# Patient Record
Sex: Female | Born: 1985 | Race: White | Hispanic: No | Marital: Single | Smoking: Never smoker
Health system: Southern US, Community
[De-identification: ages and names within clinical notes are randomized; demographics above are authoritative.]

## PROBLEM LIST (undated history)

## (undated) DIAGNOSIS — B9689 Other specified bacterial agents as the cause of diseases classified elsewhere: Secondary | ICD-10-CM

## (undated) DIAGNOSIS — J309 Allergic rhinitis, unspecified: Secondary | ICD-10-CM

## (undated) DIAGNOSIS — E669 Obesity, unspecified: Secondary | ICD-10-CM

## (undated) DIAGNOSIS — F419 Anxiety disorder, unspecified: Secondary | ICD-10-CM

## (undated) DIAGNOSIS — R21 Rash and other nonspecific skin eruption: Secondary | ICD-10-CM

## (undated) DIAGNOSIS — Z9889 Other specified postprocedural states: Secondary | ICD-10-CM

## (undated) DIAGNOSIS — L709 Acne, unspecified: Secondary | ICD-10-CM

## (undated) DIAGNOSIS — N390 Urinary tract infection, site not specified: Secondary | ICD-10-CM

## (undated) DIAGNOSIS — N760 Acute vaginitis: Secondary | ICD-10-CM

## (undated) DIAGNOSIS — B379 Candidiasis, unspecified: Secondary | ICD-10-CM

## (undated) DIAGNOSIS — K219 Gastro-esophageal reflux disease without esophagitis: Secondary | ICD-10-CM

## (undated) DIAGNOSIS — T1490XA Injury, unspecified, initial encounter: Secondary | ICD-10-CM

## (undated) DIAGNOSIS — R61 Generalized hyperhidrosis: Secondary | ICD-10-CM

## (undated) DIAGNOSIS — G43909 Migraine, unspecified, not intractable, without status migrainosus: Secondary | ICD-10-CM

## (undated) DIAGNOSIS — J45909 Unspecified asthma, uncomplicated: Secondary | ICD-10-CM

## (undated) DIAGNOSIS — K2100 Gastro-esophageal reflux disease with esophagitis, without bleeding: Secondary | ICD-10-CM

## (undated) DIAGNOSIS — E069 Thyroiditis, unspecified: Secondary | ICD-10-CM

## (undated) DIAGNOSIS — N3281 Overactive bladder: Secondary | ICD-10-CM

## (undated) DIAGNOSIS — M1990 Unspecified osteoarthritis, unspecified site: Secondary | ICD-10-CM

## (undated) DIAGNOSIS — F32A Depression, unspecified: Secondary | ICD-10-CM

## (undated) DIAGNOSIS — T7840XA Allergy, unspecified, initial encounter: Secondary | ICD-10-CM

## (undated) DIAGNOSIS — IMO0002 Reserved for concepts with insufficient information to code with codable children: Secondary | ICD-10-CM

## (undated) DIAGNOSIS — M545 Low back pain, unspecified: Secondary | ICD-10-CM

## (undated) DIAGNOSIS — Z7689 Persons encountering health services in other specified circumstances: Secondary | ICD-10-CM

## (undated) DIAGNOSIS — L0292 Furuncle, unspecified: Secondary | ICD-10-CM

## (undated) DIAGNOSIS — F329 Major depressive disorder, single episode, unspecified: Secondary | ICD-10-CM

## (undated) DIAGNOSIS — R112 Nausea with vomiting, unspecified: Secondary | ICD-10-CM

## (undated) DIAGNOSIS — L509 Urticaria, unspecified: Secondary | ICD-10-CM

## (undated) DIAGNOSIS — R519 Headache, unspecified: Secondary | ICD-10-CM

## (undated) HISTORY — DX: Generalized hyperhidrosis: R61

## (undated) HISTORY — DX: Gastro-esophageal reflux disease with esophagitis, without bleeding: K21.00

## (undated) HISTORY — DX: Rash and other nonspecific skin eruption: R21

## (undated) HISTORY — DX: Overactive bladder: N32.81

## (undated) HISTORY — DX: Obesity, unspecified: E66.9

## (undated) HISTORY — DX: Migraine, unspecified, not intractable, without status migrainosus: G43.909

## (undated) HISTORY — DX: Headache, unspecified: R51.9

## (undated) HISTORY — DX: Candidiasis, unspecified: B37.9

## (undated) HISTORY — DX: Furuncle, unspecified: L02.92

## (undated) HISTORY — DX: Reserved for concepts with insufficient information to code with codable children: IMO0002

## (undated) HISTORY — DX: Depression, unspecified: F32.A

## (undated) HISTORY — DX: Other specified bacterial agents as the cause of diseases classified elsewhere: B96.89

## (undated) HISTORY — DX: Anxiety disorder, unspecified: F41.9

## (undated) HISTORY — DX: Injury, unspecified, initial encounter: T14.90XA

## (undated) HISTORY — DX: Allergy, unspecified, initial encounter: T78.40XA

## (undated) HISTORY — DX: Allergic rhinitis, unspecified: J30.9

## (undated) HISTORY — DX: Low back pain, unspecified: M54.50

## (undated) HISTORY — DX: Thyroiditis, unspecified: E06.9

## (undated) HISTORY — DX: Unspecified asthma, uncomplicated: J45.909

## (undated) HISTORY — DX: Major depressive disorder, single episode, unspecified: F32.9

## (undated) HISTORY — DX: Urticaria, unspecified: L50.9

## (undated) HISTORY — DX: Urinary tract infection, site not specified: N39.0

## (undated) HISTORY — DX: Acne, unspecified: L70.9

## (undated) HISTORY — PX: APPENDECTOMY: SHX54

## (undated) HISTORY — DX: Other specified bacterial agents as the cause of diseases classified elsewhere: N76.0

## (undated) HISTORY — DX: Persons encountering health services in other specified circumstances: Z76.89

---
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
## 2003-06-28 ENCOUNTER — Ambulatory Visit (HOSPITAL_COMMUNITY): Admission: RE | Admit: 2003-06-28 | Discharge: 2003-06-28 | Payer: Self-pay | Admitting: Advanced Practice Midwife

## 2007-08-25 ENCOUNTER — Other Ambulatory Visit: Admission: RE | Admit: 2007-08-25 | Discharge: 2007-08-25 | Payer: Self-pay | Admitting: Obstetrics and Gynecology

## 2008-07-14 ENCOUNTER — Ambulatory Visit (HOSPITAL_COMMUNITY): Admission: RE | Admit: 2008-07-14 | Discharge: 2008-07-14 | Payer: Self-pay | Admitting: Obstetrics & Gynecology

## 2008-08-28 ENCOUNTER — Other Ambulatory Visit: Admission: RE | Admit: 2008-08-28 | Discharge: 2008-08-28 | Payer: Self-pay | Admitting: Obstetrics & Gynecology

## 2009-11-02 ENCOUNTER — Other Ambulatory Visit: Admission: RE | Admit: 2009-11-02 | Discharge: 2009-11-02 | Payer: Self-pay | Admitting: Obstetrics & Gynecology

## 2011-10-06 LAB — LIPID PANEL
Cholesterol: 195 (ref 0–200)
HDL: 41 (ref 35–70)
LDL Cholesterol: 112
Triglycerides: 209 — AB (ref 40–160)

## 2011-10-06 LAB — TSH: TSH: 4.46 (ref <=5.90)

## 2012-07-05 ENCOUNTER — Encounter: Payer: Self-pay | Admitting: *Deleted

## 2012-07-05 ENCOUNTER — Ambulatory Visit (INDEPENDENT_AMBULATORY_CARE_PROVIDER_SITE_OTHER): Payer: Medicaid Other | Admitting: Obstetrics & Gynecology

## 2012-07-05 ENCOUNTER — Encounter: Payer: Self-pay | Admitting: Obstetrics & Gynecology

## 2012-07-05 VITALS — BP 116/80 | Ht 63.0 in | Wt 233.0 lb

## 2012-07-05 DIAGNOSIS — Z3202 Encounter for pregnancy test, result negative: Secondary | ICD-10-CM

## 2012-07-05 DIAGNOSIS — Z3049 Encounter for surveillance of other contraceptives: Secondary | ICD-10-CM

## 2012-07-05 DIAGNOSIS — Z3200 Encounter for pregnancy test, result unknown: Secondary | ICD-10-CM

## 2012-07-05 DIAGNOSIS — Z309 Encounter for contraceptive management, unspecified: Secondary | ICD-10-CM

## 2012-07-05 MED ORDER — MEDROXYPROGESTERONE ACETATE 150 MG/ML IM SUSP
150.0000 mg | Freq: Once | INTRAMUSCULAR | Status: AC
  Administered 2012-07-05: 150 mg via INTRAMUSCULAR

## 2012-08-13 ENCOUNTER — Other Ambulatory Visit: Payer: Self-pay | Admitting: Adult Health

## 2012-09-22 ENCOUNTER — Other Ambulatory Visit: Payer: Self-pay | Admitting: Adult Health

## 2012-09-24 ENCOUNTER — Ambulatory Visit: Payer: Medicaid Other

## 2012-10-01 ENCOUNTER — Ambulatory Visit (INDEPENDENT_AMBULATORY_CARE_PROVIDER_SITE_OTHER): Payer: Medicaid Other | Admitting: Obstetrics & Gynecology

## 2012-10-01 ENCOUNTER — Encounter: Payer: Self-pay | Admitting: Obstetrics & Gynecology

## 2012-10-01 VITALS — BP 100/70 | Ht 62.0 in | Wt 229.0 lb

## 2012-10-01 DIAGNOSIS — Z3202 Encounter for pregnancy test, result negative: Secondary | ICD-10-CM

## 2012-10-01 DIAGNOSIS — Z3049 Encounter for surveillance of other contraceptives: Secondary | ICD-10-CM

## 2012-10-01 MED ORDER — MEDROXYPROGESTERONE ACETATE 150 MG/ML IM SUSP
150.0000 mg | Freq: Once | INTRAMUSCULAR | Status: AC
  Administered 2012-10-01: 150 mg via INTRAMUSCULAR

## 2012-10-01 NOTE — Progress Notes (Signed)
Pt here for Depo Provera. Complaining of burning with urination. Urine dip was negative. Advised if burning continues to see dr. Rock Schneider scheduled to see Kendra Schneider Monday. JSY

## 2012-12-24 ENCOUNTER — Other Ambulatory Visit: Payer: Self-pay | Admitting: Adult Health

## 2012-12-31 ENCOUNTER — Encounter: Payer: Self-pay | Admitting: Obstetrics & Gynecology

## 2012-12-31 ENCOUNTER — Encounter (INDEPENDENT_AMBULATORY_CARE_PROVIDER_SITE_OTHER): Payer: Self-pay

## 2012-12-31 ENCOUNTER — Ambulatory Visit (INDEPENDENT_AMBULATORY_CARE_PROVIDER_SITE_OTHER): Payer: Medicaid Other | Admitting: Obstetrics & Gynecology

## 2012-12-31 VITALS — BP 102/60 | Ht 62.0 in | Wt 229.5 lb

## 2012-12-31 DIAGNOSIS — Z3202 Encounter for pregnancy test, result negative: Secondary | ICD-10-CM

## 2012-12-31 DIAGNOSIS — Z3049 Encounter for surveillance of other contraceptives: Secondary | ICD-10-CM

## 2012-12-31 DIAGNOSIS — R300 Dysuria: Secondary | ICD-10-CM

## 2012-12-31 LAB — POCT URINALYSIS DIPSTICK
Blood, UA: NEGATIVE
Glucose, UA: NEGATIVE
Ketones, UA: NEGATIVE
Leukocytes, UA: NEGATIVE
Nitrite, UA: NEGATIVE
Protein, UA: NEGATIVE

## 2012-12-31 MED ORDER — MEDROXYPROGESTERONE ACETATE 150 MG/ML IM SUSP
150.0000 mg | Freq: Once | INTRAMUSCULAR | Status: AC
  Administered 2012-12-31: 150 mg via INTRAMUSCULAR

## 2012-12-31 NOTE — Progress Notes (Signed)
Pt here for Depo shot. Pt complained of burning with urination, states this is an ongoing problem. Urine dip was negative here. Advised to increase fluids and if it continued, call Dr. Juanetta Gosling. Pt voiced understanding. JSY

## 2013-02-24 ENCOUNTER — Other Ambulatory Visit: Payer: Self-pay | Admitting: Adult Health

## 2013-03-21 ENCOUNTER — Other Ambulatory Visit: Payer: Self-pay | Admitting: Adult Health

## 2013-03-25 ENCOUNTER — Encounter: Payer: Self-pay | Admitting: Adult Health

## 2013-03-25 ENCOUNTER — Ambulatory Visit (INDEPENDENT_AMBULATORY_CARE_PROVIDER_SITE_OTHER): Payer: Medicaid Other | Admitting: Adult Health

## 2013-03-25 ENCOUNTER — Encounter (INDEPENDENT_AMBULATORY_CARE_PROVIDER_SITE_OTHER): Payer: Self-pay

## 2013-03-25 VITALS — BP 112/72 | Ht 65.0 in | Wt 231.5 lb

## 2013-03-25 DIAGNOSIS — Z3202 Encounter for pregnancy test, result negative: Secondary | ICD-10-CM

## 2013-03-25 DIAGNOSIS — Z309 Encounter for contraceptive management, unspecified: Secondary | ICD-10-CM

## 2013-03-25 DIAGNOSIS — Z3049 Encounter for surveillance of other contraceptives: Secondary | ICD-10-CM

## 2013-03-25 LAB — POCT URINE PREGNANCY: PREG TEST UR: NEGATIVE

## 2013-03-25 MED ORDER — MEDROXYPROGESTERONE ACETATE 150 MG/ML IM SUSP
150.0000 mg | Freq: Once | INTRAMUSCULAR | Status: AC
Start: 2013-03-25 — End: 2013-03-25
  Administered 2013-03-25: 150 mg via INTRAMUSCULAR

## 2013-03-25 NOTE — Progress Notes (Signed)
Pt here for Depo shot. Pt with leg pain. Advised to contact PCP for advise. Pt to return in 12 weeks for next shot. [place]

## 2013-04-19 ENCOUNTER — Other Ambulatory Visit: Payer: Self-pay | Admitting: Adult Health

## 2013-06-17 ENCOUNTER — Encounter: Payer: Self-pay | Admitting: Obstetrics & Gynecology

## 2013-06-17 ENCOUNTER — Other Ambulatory Visit: Payer: Self-pay | Admitting: Adult Health

## 2013-06-17 ENCOUNTER — Ambulatory Visit (INDEPENDENT_AMBULATORY_CARE_PROVIDER_SITE_OTHER): Payer: Medicaid Other | Admitting: Obstetrics & Gynecology

## 2013-06-17 VITALS — BP 98/60 | Ht 62.0 in | Wt 226.5 lb

## 2013-06-17 DIAGNOSIS — Z3049 Encounter for surveillance of other contraceptives: Secondary | ICD-10-CM

## 2013-06-17 DIAGNOSIS — Z309 Encounter for contraceptive management, unspecified: Secondary | ICD-10-CM

## 2013-06-17 DIAGNOSIS — Z3202 Encounter for pregnancy test, result negative: Secondary | ICD-10-CM

## 2013-06-17 LAB — POCT URINE PREGNANCY: Preg Test, Ur: NEGATIVE

## 2013-06-17 MED ORDER — MEDROXYPROGESTERONE ACETATE 150 MG/ML IM SUSP
150.0000 mg | Freq: Once | INTRAMUSCULAR | Status: AC
  Administered 2013-06-17: 150 mg via INTRAMUSCULAR

## 2013-06-17 NOTE — Progress Notes (Signed)
Pt here for Depo shot. No complaints at this time. To return in 12 weeks for next shot. [place]

## 2013-07-22 ENCOUNTER — Encounter: Payer: Self-pay | Admitting: Adult Health

## 2013-07-22 ENCOUNTER — Other Ambulatory Visit (HOSPITAL_COMMUNITY)
Admission: RE | Admit: 2013-07-22 | Discharge: 2013-07-22 | Disposition: A | Discharge status: Home / Self Care | Payer: Medicaid Other | Source: Ambulatory Visit | Attending: Adult Health | Admitting: Adult Health

## 2013-07-22 ENCOUNTER — Ambulatory Visit (INDEPENDENT_AMBULATORY_CARE_PROVIDER_SITE_OTHER): Payer: Medicaid Other | Admitting: Adult Health

## 2013-07-22 VITALS — BP 104/60 | HR 78 | Ht 60.0 in | Wt 221.0 lb

## 2013-07-22 DIAGNOSIS — Z0000 Encounter for general adult medical examination without abnormal findings: Secondary | ICD-10-CM

## 2013-07-22 DIAGNOSIS — Z01419 Encounter for gynecological examination (general) (routine) without abnormal findings: Secondary | ICD-10-CM | POA: Insufficient documentation

## 2013-07-22 DIAGNOSIS — Z1151 Encounter for screening for human papillomavirus (HPV): Secondary | ICD-10-CM | POA: Insufficient documentation

## 2013-07-22 DIAGNOSIS — B379 Candidiasis, unspecified: Secondary | ICD-10-CM

## 2013-07-22 HISTORY — DX: Candidiasis, unspecified: B37.9

## 2013-07-22 MED ORDER — FLUCONAZOLE 150 MG PO TABS: ORAL_TABLET | ORAL | Status: DC

## 2013-07-22 NOTE — Progress Notes (Signed)
Patient ID: Kendra Schneider, female   DOB: 1985/11/29, 28 y.o.   MRN: [id number] History of Present Illness: Kendra Schneider is a 28 year old white female, in for pap and physical.Happy with Depo.   Current Medications, Allergies, Past Medical History, Past Surgical History, Family History and Social History were reviewed in [employer] record.     Review of Systems: Patient denies any headaches, blurred vision, shortness of breath, chest pain, abdominal pain, problems with bowel movements, urination.She does not have sex,her legs ache near groin, moods good, thinks she has yeast.    Physical Exam:BP 104/60  Pulse 78  Ht 5' (1.524 m)  Wt 221 lb (100.245 kg)  BMI 43.16 kg/m2 General:  Well developed, well nourished, no acute distress Skin:  Warm and dry Neck:  Midline trachea, normal thyroid Lungs; Clear to auscultation bilaterally Breast:  No dominant palpable mass, retraction, or nipple discharge Cardiovascular: Regular rate and rhythm Abdomen:  Soft, non tender, no hepatosplenomegaly,obese Pelvic:  External genitalia is normal in appearance.  The vagina is red and irritated, scant white thin discharge.Looks like yeast. The cervix is not well visualized due to pt holding legs together,pap with HPV performed by blind seep.  Uterus is felt to be normal size, shape, and contour.  No  adnexal masses or tenderness noted. Extremities:  No swelling or varicosities noted Psych:  No mood , alert and cooperative, seems happy   Impression: Yearly gyn exam Yeast     Plan: Physical in 1 year Rx diflucan 150 mg #2 1 now and 1 in 3 days with 2 refills Keep clean and dry Get depo in august as scheduled

## 2013-07-22 NOTE — Patient Instructions (Signed)
Monilial Vaginitis Vaginitis in a soreness, swelling and redness (inflammation) of the vagina and vulva. Monilial vaginitis is not a sexually transmitted infection. CAUSES  Yeast vaginitis is caused by yeast (candida) that is normally found in your vagina. With a yeast infection, the candida has overgrown in number to a point that upsets the chemical balance. SYMPTOMS   White, thick vaginal discharge.  Swelling, itching, redness and irritation of the vagina and possibly the lips of the vagina (vulva).  Burning or painful urination.  Painful intercourse. DIAGNOSIS  Things that may contribute to monilial vaginitis are:  Postmenopausal and virginal states.  Pregnancy.  Infections.  Being tired, sick or stressed, especially if you had monilial vaginitis in the past.  Diabetes. Good control will help lower the chance.  Birth control pills.  Tight fitting garments.  Using bubble bath, feminine sprays, douches or deodorant tampons.  Taking certain medications that kill germs (antibiotics).  Sporadic recurrence can occur if you become ill. TREATMENT  Your caregiver will give you medication.  There are several kinds of anti monilial vaginal creams and suppositories specific for monilial vaginitis. For recurrent yeast infections, use a suppository or cream in the vagina 2 times a week, or as directed.  Anti-monilial or steroid cream for the itching or irritation of the vulva may also be used. Get your caregiver's permission.  Painting the vagina with methylene blue solution may help if the monilial cream does not work.  Eating yogurt may help prevent monilial vaginitis. HOME CARE INSTRUCTIONS   Finish all medication as prescribed.  Do not have sex until treatment is completed or after your caregiver tells you it is okay.  Take warm sitz baths.  Do not douche.  Do not use tampons, especially scented ones.  Wear cotton underwear.  Avoid tight pants and panty  hose.  Tell your sexual partner that you have a yeast infection. They should go to their caregiver if they have symptoms such as mild rash or itching.  Your sexual partner should be treated as well if your infection is difficult to eliminate.  Practice safer sex. Use condoms.  Some vaginal medications cause latex condoms to fail. Vaginal medications that harm condoms are:  Cleocin cream.  Butoconazole (Femstat).  Terconazole (Terazol) vaginal suppository.  Miconazole (Monistat) (may be purchased over the counter). SEEK MEDICAL CARE IF:   You have a temperature by mouth above 102 F (38.9 C).  The infection is getting worse after 2 days of treatment.  The infection is not getting better after 3 days of treatment.  You develop blisters in or around your vagina.  You develop vaginal bleeding, and it is not your menstrual period.  You have pain when you urinate.  You develop intestinal problems.  You have pain with sexual intercourse. Document Released: 10/23/2004 Document Revised: 04/07/2011 Document Reviewed: 07/07/2008 [facility] Patient Information 2015 [place], [place]. This information is not intended to replace advice given to you by your health care provider. Make sure you discuss any questions you have with your health care provider. Physical in 1 year Pap in 3 years

## 2013-07-25 LAB — CYTOLOGY - PAP

## 2013-09-09 ENCOUNTER — Ambulatory Visit (INDEPENDENT_AMBULATORY_CARE_PROVIDER_SITE_OTHER): Payer: Medicaid Other | Admitting: Adult Health

## 2013-09-09 ENCOUNTER — Encounter: Payer: Self-pay | Admitting: Adult Health

## 2013-09-09 DIAGNOSIS — Z3202 Encounter for pregnancy test, result negative: Secondary | ICD-10-CM

## 2013-09-09 DIAGNOSIS — Z309 Encounter for contraceptive management, unspecified: Secondary | ICD-10-CM

## 2013-09-09 DIAGNOSIS — Z3049 Encounter for surveillance of other contraceptives: Secondary | ICD-10-CM

## 2013-09-09 LAB — POCT URINE PREGNANCY: Preg Test, Ur: NEGATIVE

## 2013-09-09 MED ORDER — MEDROXYPROGESTERONE ACETATE 150 MG/ML IM SUSP
150.0000 mg | Freq: Once | INTRAMUSCULAR | Status: AC
  Administered 2013-09-09: 150 mg via INTRAMUSCULAR

## 2013-10-07 ENCOUNTER — Ambulatory Visit: Payer: Self-pay | Admitting: Endocrinology

## 2013-11-08 ENCOUNTER — Encounter: Payer: Self-pay | Admitting: Endocrinology

## 2013-11-08 ENCOUNTER — Ambulatory Visit (INDEPENDENT_AMBULATORY_CARE_PROVIDER_SITE_OTHER): Payer: Medicaid Other | Admitting: Endocrinology

## 2013-11-08 VITALS — BP 122/86 | HR 77 | Temp 98.7°F | Ht 60.0 in | Wt 218.0 lb

## 2013-11-08 DIAGNOSIS — E065 Other chronic thyroiditis: Secondary | ICD-10-CM
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
## 2013-11-08 NOTE — Patient Instructions (Addendum)
Your thyroid is normal now, so no treatment is needed for that. You have a higher than average risk of an underactive thyroid, as well as other immune conditions. You should have your thyroid checked at least year.   I would be happy to see you back here whenever you want.

## 2013-11-09 ENCOUNTER — Other Ambulatory Visit: Payer: Self-pay | Admitting: Adult Health

## 2013-11-09 DIAGNOSIS — E065 Other chronic thyroiditis: Secondary | ICD-10-CM | POA: Insufficient documentation

## 2013-12-02 ENCOUNTER — Encounter: Payer: Self-pay | Admitting: Adult Health

## 2013-12-02 ENCOUNTER — Ambulatory Visit (INDEPENDENT_AMBULATORY_CARE_PROVIDER_SITE_OTHER): Payer: Medicaid Other | Admitting: Adult Health

## 2013-12-02 DIAGNOSIS — Z3202 Encounter for pregnancy test, result negative: Secondary | ICD-10-CM

## 2013-12-02 DIAGNOSIS — Z3042 Encounter for surveillance of injectable contraceptive: Secondary | ICD-10-CM

## 2013-12-02 LAB — POCT URINE PREGNANCY: PREG TEST UR: NEGATIVE

## 2013-12-02 MED ORDER — MEDROXYPROGESTERONE ACETATE 150 MG/ML IM SUSP
150.0000 mg | Freq: Once | INTRAMUSCULAR | Status: AC
  Administered 2013-12-02: 150 mg via INTRAMUSCULAR

## 2014-01-31 ENCOUNTER — Other Ambulatory Visit: Payer: Self-pay | Admitting: Adult Health

## 2014-02-23 ENCOUNTER — Other Ambulatory Visit: Payer: Self-pay | Admitting: Adult Health

## 2014-02-24 ENCOUNTER — Ambulatory Visit (INDEPENDENT_AMBULATORY_CARE_PROVIDER_SITE_OTHER): Payer: Medicaid Other | Admitting: *Deleted

## 2014-02-24 ENCOUNTER — Encounter: Payer: Self-pay | Admitting: *Deleted

## 2014-02-24 DIAGNOSIS — Z3042 Encounter for surveillance of injectable contraceptive: Secondary | ICD-10-CM

## 2014-02-24 DIAGNOSIS — Z3202 Encounter for pregnancy test, result negative: Secondary | ICD-10-CM

## 2014-02-24 DIAGNOSIS — R300 Dysuria: Secondary | ICD-10-CM

## 2014-02-24 LAB — POCT URINALYSIS DIPSTICK
Glucose, UA: NEGATIVE
KETONES UA: NEGATIVE
Nitrite, UA: NEGATIVE
Protein, UA: NEGATIVE

## 2014-02-24 LAB — POCT URINE PREGNANCY: Preg Test, Ur: NEGATIVE

## 2014-02-24 MED ORDER — MEDROXYPROGESTERONE ACETATE 150 MG/ML IM SUSP
150.0000 mg | Freq: Once | INTRAMUSCULAR | Status: AC
  Administered 2014-02-24: 150 mg via INTRAMUSCULAR

## 2014-02-24 NOTE — Progress Notes (Signed)
Pt here for Depo. Pt reports burning with urination for a while. I dipped urine and it showed 2+ leuk and a trace of blood. Pt is wanting an antibiotic. I spoke with JAG and she recommends to push fluids. No antibiotic needed at this time. Pt voiced understanding. [place]

## 2014-03-28 ENCOUNTER — Encounter: Payer: Self-pay | Admitting: Family Medicine

## 2014-05-19 ENCOUNTER — Ambulatory Visit: Payer: Medicaid Other

## 2014-05-22 ENCOUNTER — Encounter: Payer: Self-pay | Admitting: *Deleted

## 2014-05-22 ENCOUNTER — Ambulatory Visit (INDEPENDENT_AMBULATORY_CARE_PROVIDER_SITE_OTHER): Payer: Medicaid Other | Admitting: *Deleted

## 2014-05-22 DIAGNOSIS — Z3042 Encounter for surveillance of injectable contraceptive: Secondary | ICD-10-CM | POA: Diagnosis not present

## 2014-05-22 DIAGNOSIS — Z3202 Encounter for pregnancy test, result negative: Secondary | ICD-10-CM

## 2014-05-22 LAB — POCT URINE PREGNANCY: Preg Test, Ur: NEGATIVE

## 2014-05-22 MED ORDER — MEDROXYPROGESTERONE ACETATE 150 MG/ML IM SUSP
150.0000 mg | Freq: Once | INTRAMUSCULAR | Status: AC
  Administered 2014-05-22: 150 mg via INTRAMUSCULAR

## 2014-05-22 NOTE — Progress Notes (Signed)
Pt here for Depo. Reports no problems at this time. Return in 12 weeks for next shot. JSY 

## 2014-05-26 ENCOUNTER — Other Ambulatory Visit: Payer: Self-pay | Admitting: Adult Health

## 2014-06-28 ENCOUNTER — Other Ambulatory Visit: Payer: Self-pay | Admitting: Adult Health

## 2014-08-18 ENCOUNTER — Ambulatory Visit (INDEPENDENT_AMBULATORY_CARE_PROVIDER_SITE_OTHER): Payer: Medicaid Other | Admitting: *Deleted

## 2014-08-18 DIAGNOSIS — Z3202 Encounter for pregnancy test, result negative: Secondary | ICD-10-CM | POA: Diagnosis not present

## 2014-08-18 DIAGNOSIS — Z3042 Encounter for surveillance of injectable contraceptive: Secondary | ICD-10-CM | POA: Diagnosis not present

## 2014-08-18 MED ORDER — MEDROXYPROGESTERONE ACETATE 150 MG/ML IM SUSP
150.0000 mg | Freq: Once | INTRAMUSCULAR | Status: AC
  Administered 2014-08-18: 150 mg via INTRAMUSCULAR

## 2014-08-18 NOTE — Progress Notes (Signed)
Patient ID: Kendra Schneider, female   DOB: 1985-04-26, 29 y.o.   MRN: [id number] Pt was here today for Depo Provera injection, pt reports no problems at this time.  Injection given in rt buttock and informed to return in 3 months for next injection

## 2014-08-21 LAB — POCT URINE PREGNANCY: PREG TEST UR: NEGATIVE

## 2014-09-12 ENCOUNTER — Ambulatory Visit (INDEPENDENT_AMBULATORY_CARE_PROVIDER_SITE_OTHER): Payer: Medicaid Other | Admitting: Urology

## 2014-09-12 DIAGNOSIS — R300 Dysuria: Secondary | ICD-10-CM

## 2014-11-10 ENCOUNTER — Ambulatory Visit (INDEPENDENT_AMBULATORY_CARE_PROVIDER_SITE_OTHER): Payer: Medicaid Other | Admitting: *Deleted

## 2014-11-10 DIAGNOSIS — Z3042 Encounter for surveillance of injectable contraceptive: Secondary | ICD-10-CM | POA: Diagnosis not present

## 2014-11-10 DIAGNOSIS — Z3202 Encounter for pregnancy test, result negative: Secondary | ICD-10-CM

## 2014-11-10 LAB — POCT URINE PREGNANCY: PREG TEST UR: NEGATIVE

## 2014-11-10 MED ORDER — MEDROXYPROGESTERONE ACETATE 150 MG/ML IM SUSP
150.0000 mg | Freq: Once | INTRAMUSCULAR | Status: AC
  Administered 2014-11-10: 150 mg via INTRAMUSCULAR

## 2014-11-10 NOTE — Progress Notes (Signed)
Patient ID: Kendra Schneider, female   DOB: [birth year], 29 y.o.   MRN: [id number] Depo provera 150 mg IM given left ventrogluteal with no complications, negative pregnancy test. Pt to return in 12 weeks for next injection.

## 2014-11-30 ENCOUNTER — Other Ambulatory Visit: Payer: Self-pay | Admitting: Adult Health

## 2014-12-12 ENCOUNTER — Ambulatory Visit (INDEPENDENT_AMBULATORY_CARE_PROVIDER_SITE_OTHER): Payer: Medicaid Other | Admitting: Urology

## 2014-12-12 DIAGNOSIS — R300 Dysuria: Secondary | ICD-10-CM | POA: Diagnosis not present

## 2015-02-02 ENCOUNTER — Ambulatory Visit (INDEPENDENT_AMBULATORY_CARE_PROVIDER_SITE_OTHER): Payer: Medicaid Other | Admitting: *Deleted

## 2015-02-02 ENCOUNTER — Encounter: Payer: Self-pay | Admitting: *Deleted

## 2015-02-02 DIAGNOSIS — Z3042 Encounter for surveillance of injectable contraceptive: Secondary | ICD-10-CM

## 2015-02-02 DIAGNOSIS — Z3202 Encounter for pregnancy test, result negative: Secondary | ICD-10-CM

## 2015-02-02 LAB — POCT URINE PREGNANCY: Preg Test, Ur: NEGATIVE

## 2015-02-02 MED ORDER — MEDROXYPROGESTERONE ACETATE 150 MG/ML IM SUSP
150.0000 mg | Freq: Once | INTRAMUSCULAR | Status: AC
  Administered 2015-02-02: 150 mg via INTRAMUSCULAR

## 2015-02-02 NOTE — Progress Notes (Signed)
Pt here for Depo. Reports no problems at this time. Return in 12 weeks for next shot. JSY 

## 2015-03-02 ENCOUNTER — Other Ambulatory Visit: Payer: Self-pay | Admitting: Adult Health

## 2015-03-23 ENCOUNTER — Telehealth: Payer: Self-pay | Admitting: Adult Health

## 2015-03-23 NOTE — Telephone Encounter (Signed)
Pt's last physical was 06/2013. I spoke with JAG and she advised to order 1 refill on her Depo and pt needs to schedule a physical appt. Pt's mom aware and will schedule appt. [place]

## 2015-03-23 NOTE — Telephone Encounter (Signed)
Pt called stating that she needs a refill of her Depo. Please contact pt

## 2015-04-04 ENCOUNTER — Other Ambulatory Visit: Payer: Self-pay | Admitting: Obstetrics & Gynecology

## 2015-05-04 ENCOUNTER — Ambulatory Visit (INDEPENDENT_AMBULATORY_CARE_PROVIDER_SITE_OTHER): Payer: Medicaid Other | Admitting: *Deleted

## 2015-05-04 DIAGNOSIS — Z3042 Encounter for surveillance of injectable contraceptive: Secondary | ICD-10-CM | POA: Diagnosis not present

## 2015-05-04 DIAGNOSIS — Z3202 Encounter for pregnancy test, result negative: Secondary | ICD-10-CM

## 2015-05-04 LAB — POCT URINE PREGNANCY: Preg Test, Ur: NEGATIVE

## 2015-05-04 MED ORDER — MEDROXYPROGESTERONE ACETATE 150 MG/ML IM SUSP
150.0000 mg | Freq: Once | INTRAMUSCULAR | Status: AC
  Administered 2015-05-04: 150 mg via INTRAMUSCULAR

## 2015-05-04 NOTE — Progress Notes (Signed)
Patient ID: Kendra Schneider, female   DOB: [birth year], 30 y.o.   MRN: [id number] Depo Provera 150 mg IM injection given left ventrogluteal with no complications, negative pregnancy test. Pt to return in 12 weeks for next injection.

## 2015-07-13 ENCOUNTER — Ambulatory Visit (INDEPENDENT_AMBULATORY_CARE_PROVIDER_SITE_OTHER): Payer: Medicaid Other | Admitting: Adult Health

## 2015-07-13 ENCOUNTER — Encounter: Payer: Self-pay | Admitting: Adult Health

## 2015-07-13 ENCOUNTER — Other Ambulatory Visit: Payer: Medicaid Other | Admitting: Adult Health

## 2015-07-13 VITALS — BP 112/78 | HR 60 | Ht 60.0 in | Wt 222.0 lb

## 2015-07-13 DIAGNOSIS — Z01419 Encounter for gynecological examination (general) (routine) without abnormal findings: Secondary | ICD-10-CM | POA: Diagnosis not present

## 2015-07-13 DIAGNOSIS — Z0000 Encounter for general adult medical examination without abnormal findings: Secondary | ICD-10-CM | POA: Diagnosis not present

## 2015-07-13 DIAGNOSIS — Z3042 Encounter for surveillance of injectable contraceptive: Secondary | ICD-10-CM

## 2015-07-13 DIAGNOSIS — Z7689 Persons encountering health services in other specified circumstances: Secondary | ICD-10-CM

## 2015-07-13 HISTORY — DX: Persons encountering health services in other specified circumstances: Z76.89

## 2015-07-13 MED ORDER — MEDROXYPROGESTERONE ACETATE 150 MG/ML IM SUSP: 150.0000 mg | INTRAMUSCULAR | Status: DC

## 2015-07-13 NOTE — Patient Instructions (Signed)
Pap and physical in 1 year Depo as scheduled  Mammogram at 74

## 2015-07-13 NOTE — Progress Notes (Signed)
Patient ID: Kendra Schneider, female   DOB: [birth year], 30 y.o.   MRN: [id number] History of Present Illness: Kendra Schneider is a 30 year old white female, single in for a well woman gyn exam, she had a normal pap with negative HPV 07/22/13.She is on depo for period management and is happy with it. PCP is Ed Luan Pulling.  Current Medications, Allergies, Past Medical History, Past Surgical History, Family History and Social History were reviewed in [employer] record.     Review of Systems: Patient denies any daily headaches, hearing loss, fatigue, blurred vision, shortness of breath, chest pain, abdominal pain, problems with bowel movements, urination, or intercourse(not having sex). No joint pain or mood swings.    Physical Exam:BP 112/78 mmHg  Pulse 60  Ht 5' (1.524 m)  Wt 222 lb (100.699 kg)  BMI 43.36 kg/m2 General:  Well developed, well nourished, no acute distress Skin:  Warm and dry Neck:  Midline trachea, normal thyroid, good ROM, no lymphadenopathy Lungs; Clear to auscultation bilaterally Breast:  No dominant palpable mass, retraction, or nipple discharge,large Cardiovascular: Regular rate and rhythm Abdomen:  Soft, non tender, no hepatosplenomegaly,obese Pelvic:  External genitalia is normal in appearance, no lesions.  The vagina is normal in appearance. Urethra has no lesions or masses. The cervix is tiny.  Uterus is felt to be normal size, shape, and contour.  No adnexal masses or tenderness noted.Bladder is non tender, no masses felt. Extremities/musculoskeletal:  No swelling or varicosities noted, no clubbing or cyanosis Psych:  No mood changes, alert and cooperative,seems happy   Impression:  Well woman gyn exam no pap Period management Surveillance of depo provera   Plan: Rx Depo provera 150 mg, disp.# 1 vial for IM injection every 3 months in office, with 4 refills Return in 1 year for pap and physical  Depo as scheduled Mammogram at 12  Labs with  PCP

## 2015-07-27 ENCOUNTER — Ambulatory Visit (INDEPENDENT_AMBULATORY_CARE_PROVIDER_SITE_OTHER): Payer: Medicaid Other | Admitting: *Deleted

## 2015-07-27 DIAGNOSIS — Z3042 Encounter for surveillance of injectable contraceptive: Secondary | ICD-10-CM | POA: Diagnosis not present

## 2015-07-27 DIAGNOSIS — Z3202 Encounter for pregnancy test, result negative: Secondary | ICD-10-CM

## 2015-07-27 LAB — POCT URINE PREGNANCY: PREG TEST UR: NEGATIVE

## 2015-07-27 MED ORDER — MEDROXYPROGESTERONE ACETATE 150 MG/ML IM SUSP
150.0000 mg | Freq: Once | INTRAMUSCULAR | Status: AC
  Administered 2015-07-27: 150 mg via INTRAMUSCULAR

## 2015-07-27 NOTE — Progress Notes (Signed)
Patient ID: Kendra Schneider, female   DOB: 1985-03-24, 30 y.o.   MRN: [id number]  Depo Provera 150 mg IM given right ventrogluteal with no complications, negative pregnancy test. Pt to return in 12 weeks for next injection.

## 2015-10-19 ENCOUNTER — Ambulatory Visit (INDEPENDENT_AMBULATORY_CARE_PROVIDER_SITE_OTHER): Payer: Medicaid Other | Admitting: *Deleted

## 2015-10-19 ENCOUNTER — Encounter: Payer: Self-pay | Admitting: *Deleted

## 2015-10-19 DIAGNOSIS — Z3202 Encounter for pregnancy test, result negative: Secondary | ICD-10-CM

## 2015-10-19 DIAGNOSIS — Z3042 Encounter for surveillance of injectable contraceptive: Secondary | ICD-10-CM | POA: Diagnosis not present

## 2015-10-19 LAB — POCT URINE PREGNANCY: Preg Test, Ur: NEGATIVE

## 2015-10-19 MED ORDER — MEDROXYPROGESTERONE ACETATE 150 MG/ML IM SUSP
150.0000 mg | Freq: Once | INTRAMUSCULAR | Status: AC
  Administered 2015-10-19: 150 mg via INTRAMUSCULAR

## 2015-10-19 NOTE — Progress Notes (Signed)
Pt here for Depo. Pt tolerated shot well. Return in 12 weeks for next shot. JSY 

## 2016-01-04 ENCOUNTER — Ambulatory Visit: Payer: Medicaid Other

## 2016-01-18 ENCOUNTER — Encounter (INDEPENDENT_AMBULATORY_CARE_PROVIDER_SITE_OTHER): Payer: Self-pay

## 2016-01-18 ENCOUNTER — Ambulatory Visit (INDEPENDENT_AMBULATORY_CARE_PROVIDER_SITE_OTHER): Payer: Medicaid Other | Admitting: *Deleted

## 2016-01-18 ENCOUNTER — Encounter: Payer: Self-pay | Admitting: *Deleted

## 2016-01-18 DIAGNOSIS — Z3042 Encounter for surveillance of injectable contraceptive: Secondary | ICD-10-CM

## 2016-01-18 DIAGNOSIS — Z3202 Encounter for pregnancy test, result negative: Secondary | ICD-10-CM | POA: Diagnosis not present

## 2016-01-18 LAB — POCT URINE PREGNANCY: Preg Test, Ur: NEGATIVE

## 2016-01-18 MED ORDER — MEDROXYPROGESTERONE ACETATE 150 MG/ML IM SUSP
150.0000 mg | Freq: Once | INTRAMUSCULAR | Status: AC
  Administered 2016-01-18: 150 mg via INTRAMUSCULAR

## 2016-01-18 NOTE — Progress Notes (Signed)
Pt here for Depo. Pt tolerated shot well. Return in 12 weeks for next shot. JSY 

## 2016-04-18 ENCOUNTER — Ambulatory Visit (INDEPENDENT_AMBULATORY_CARE_PROVIDER_SITE_OTHER): Payer: Medicaid Other

## 2016-04-18 VITALS — Wt 220.2 lb

## 2016-04-18 DIAGNOSIS — Z3202 Encounter for pregnancy test, result negative: Secondary | ICD-10-CM

## 2016-04-18 DIAGNOSIS — Z3042 Encounter for surveillance of injectable contraceptive: Secondary | ICD-10-CM | POA: Diagnosis not present

## 2016-04-18 LAB — POCT URINE PREGNANCY: Preg Test, Ur: NEGATIVE

## 2016-04-18 MED ORDER — MEDROXYPROGESTERONE ACETATE 150 MG/ML IM SUSP
150.0000 mg | Freq: Once | INTRAMUSCULAR | Status: AC
  Administered 2016-04-18: 150 mg via INTRAMUSCULAR

## 2016-04-18 NOTE — Progress Notes (Signed)
PT here for Depo Shot 150 mg IM given LT ventrogluteal.Tolerated well.Return 12 week for next shot.Pregnancy test negative.P Tamiyah Moulin CMA

## 2016-07-04 ENCOUNTER — Ambulatory Visit: Payer: Medicaid Other

## 2016-07-11 ENCOUNTER — Ambulatory Visit (INDEPENDENT_AMBULATORY_CARE_PROVIDER_SITE_OTHER): Payer: Medicaid Other | Admitting: *Deleted

## 2016-07-11 ENCOUNTER — Encounter: Payer: Self-pay | Admitting: *Deleted

## 2016-07-11 DIAGNOSIS — Z3202 Encounter for pregnancy test, result negative: Secondary | ICD-10-CM | POA: Diagnosis not present

## 2016-07-11 DIAGNOSIS — Z3042 Encounter for surveillance of injectable contraceptive: Secondary | ICD-10-CM | POA: Diagnosis not present

## 2016-07-11 LAB — POCT URINE PREGNANCY: Preg Test, Ur: NEGATIVE

## 2016-07-11 MED ORDER — MEDROXYPROGESTERONE ACETATE 150 MG/ML IM SUSP
150.0000 mg | Freq: Once | INTRAMUSCULAR | Status: AC
  Administered 2016-07-11: 150 mg via INTRAMUSCULAR

## 2016-07-11 NOTE — Progress Notes (Signed)
Pt given DepoProvera 150 mg IM right Ventrogluteal without complications. Advised pt to return in 12 weeks for next injection. Pt verbalized understanding.

## 2016-07-18 ENCOUNTER — Other Ambulatory Visit: Payer: Medicaid Other | Admitting: Adult Health

## 2016-08-22 ENCOUNTER — Other Ambulatory Visit (HOSPITAL_COMMUNITY)
Admission: RE | Admit: 2016-08-22 | Discharge: 2016-08-22 | Disposition: A | Discharge status: Home / Self Care | Payer: Medicaid Other | Source: Ambulatory Visit | Attending: Adult Health | Admitting: Adult Health

## 2016-08-22 ENCOUNTER — Ambulatory Visit (INDEPENDENT_AMBULATORY_CARE_PROVIDER_SITE_OTHER): Payer: Medicaid Other | Admitting: Adult Health

## 2016-08-22 ENCOUNTER — Encounter: Payer: Self-pay | Admitting: Adult Health

## 2016-08-22 VITALS — BP 100/60 | HR 110 | Ht 59.25 in | Wt 226.0 lb

## 2016-08-22 DIAGNOSIS — Z124 Encounter for screening for malignant neoplasm of cervix: Secondary | ICD-10-CM | POA: Diagnosis not present

## 2016-08-22 DIAGNOSIS — Z7689 Persons encountering health services in other specified circumstances: Secondary | ICD-10-CM

## 2016-08-22 DIAGNOSIS — Z01419 Encounter for gynecological examination (general) (routine) without abnormal findings: Secondary | ICD-10-CM | POA: Diagnosis not present

## 2016-08-22 DIAGNOSIS — Z3042 Encounter for surveillance of injectable contraceptive: Secondary | ICD-10-CM | POA: Diagnosis not present

## 2016-08-22 DIAGNOSIS — Z0000 Encounter for general adult medical examination without abnormal findings: Secondary | ICD-10-CM | POA: Diagnosis not present

## 2016-08-22 MED ORDER — MEDROXYPROGESTERONE ACETATE 150 MG/ML IM SUSP: 150.0000 mg | INTRAMUSCULAR | 4 refills | Status: DC
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
## 2016-08-27 LAB — CYTOLOGY - PAP
DIAGNOSIS: NEGATIVE
HPV (WINDOPATH): NOT DETECTED

## 2016-09-24 LAB — BASIC METABOLIC PANEL
BUN: 15 (ref 4–21)
CO2: 16 (ref 13–22)
Chloride: 106 (ref 99–108)
Creatinine: 0.8 (ref <=1.1)
Glucose: 69
Potassium: 4.4 (ref 3.4–5.3)
Sodium: 142 (ref 137–147)

## 2016-09-24 LAB — COMPREHENSIVE METABOLIC PANEL
Albumin: 4.2 (ref 3.5–5.0)
Calcium: 9 (ref 8.7–10.7)
GFR calc Af Amer: 109
GFR calc non Af Amer: 94

## 2016-09-24 LAB — CBC: RBC: 5.1 (ref 3.87–5.11)

## 2016-09-24 LAB — CBC AND DIFFERENTIAL
HCT: 43 (ref 36–46)
Hemoglobin: 14 (ref 12.0–16.0)
Neutrophils Absolute: 5
Platelets: 242 (ref 150–399)
WBC: 9.6

## 2016-09-24 LAB — HEPATIC FUNCTION PANEL
ALT: 19 (ref 7–35)
AST: 16 (ref 13–35)
Alkaline Phosphatase: 119 (ref 25–125)
Bilirubin, Total: 0.3

## 2016-10-08 ENCOUNTER — Ambulatory Visit (INDEPENDENT_AMBULATORY_CARE_PROVIDER_SITE_OTHER): Payer: Medicaid Other | Admitting: *Deleted

## 2016-10-08 ENCOUNTER — Encounter: Payer: Self-pay | Admitting: *Deleted

## 2016-10-08 DIAGNOSIS — Z3042 Encounter for surveillance of injectable contraceptive: Secondary | ICD-10-CM

## 2016-10-08 DIAGNOSIS — Z3202 Encounter for pregnancy test, result negative: Secondary | ICD-10-CM | POA: Diagnosis not present

## 2016-10-08 LAB — POCT URINE PREGNANCY: PREG TEST UR: NEGATIVE

## 2016-10-08 MED ORDER — MEDROXYPROGESTERONE ACETATE 150 MG/ML IM SUSP
150.0000 mg | Freq: Once | INTRAMUSCULAR | Status: AC
  Administered 2016-10-08: 150 mg via INTRAMUSCULAR

## 2016-10-08 NOTE — Progress Notes (Signed)
Pt given DepoProvera 150mg  IM left VG without complications. Advised pt to return in 12 weeks for next injection.

## 2017-01-07 ENCOUNTER — Ambulatory Visit (INDEPENDENT_AMBULATORY_CARE_PROVIDER_SITE_OTHER): Payer: Medicaid Other | Admitting: *Deleted

## 2017-01-07 ENCOUNTER — Encounter (INDEPENDENT_AMBULATORY_CARE_PROVIDER_SITE_OTHER): Payer: Self-pay

## 2017-01-07 ENCOUNTER — Encounter: Payer: Self-pay | Admitting: *Deleted

## 2017-01-07 DIAGNOSIS — Z308 Encounter for other contraceptive management: Secondary | ICD-10-CM

## 2017-01-07 DIAGNOSIS — Z3042 Encounter for surveillance of injectable contraceptive: Secondary | ICD-10-CM | POA: Diagnosis not present

## 2017-01-07 DIAGNOSIS — Z3202 Encounter for pregnancy test, result negative: Secondary | ICD-10-CM

## 2017-01-07 LAB — POCT URINE PREGNANCY: Preg Test, Ur: NEGATIVE

## 2017-01-07 MED ORDER — MEDROXYPROGESTERONE ACETATE 150 MG/ML IM SUSP
150.0000 mg | Freq: Once | INTRAMUSCULAR | Status: AC
  Administered 2017-01-07: 150 mg via INTRAMUSCULAR

## 2017-01-07 NOTE — Progress Notes (Signed)
Pt here for Depo. Pt tolerated shot well. Return in 12 weeks for next shot. JSY 

## 2017-04-03 ENCOUNTER — Encounter (INDEPENDENT_AMBULATORY_CARE_PROVIDER_SITE_OTHER): Payer: Self-pay

## 2017-04-03 ENCOUNTER — Ambulatory Visit (INDEPENDENT_AMBULATORY_CARE_PROVIDER_SITE_OTHER): Payer: Medicaid Other

## 2017-04-03 VITALS — HR 97 | Wt 241.0 lb

## 2017-04-03 DIAGNOSIS — Z3202 Encounter for pregnancy test, result negative: Secondary | ICD-10-CM | POA: Diagnosis not present

## 2017-04-03 DIAGNOSIS — Z3042 Encounter for surveillance of injectable contraceptive: Secondary | ICD-10-CM | POA: Diagnosis not present

## 2017-04-03 LAB — POCT URINE PREGNANCY: Preg Test, Ur: NEGATIVE

## 2017-04-03 MED ORDER — MEDROXYPROGESTERONE ACETATE 150 MG/ML IM SUSP
150.0000 mg | Freq: Once | INTRAMUSCULAR | Status: AC
  Administered 2017-04-03: 150 mg via INTRAMUSCULAR

## 2017-04-03 NOTE — Progress Notes (Signed)
Pt here for depo injection 150 mg IM given rt ventrogluteal. Tolerated well. Return 12 weeks for next injection. Pad CMA

## 2017-06-23 ENCOUNTER — Other Ambulatory Visit: Payer: Self-pay | Admitting: Adult Health

## 2017-06-26 ENCOUNTER — Ambulatory Visit (INDEPENDENT_AMBULATORY_CARE_PROVIDER_SITE_OTHER): Payer: Medicaid Other

## 2017-06-26 VITALS — Wt 246.0 lb

## 2017-06-26 DIAGNOSIS — Z3042 Encounter for surveillance of injectable contraceptive: Secondary | ICD-10-CM

## 2017-06-26 DIAGNOSIS — Z3202 Encounter for pregnancy test, result negative: Secondary | ICD-10-CM

## 2017-06-26 MED ORDER — MEDROXYPROGESTERONE ACETATE 150 MG/ML IM SUSP
150.0000 mg | Freq: Once | INTRAMUSCULAR | Status: AC
  Administered 2017-06-26: 150 mg via INTRAMUSCULAR

## 2017-06-26 NOTE — Addendum Note (Signed)
Addended by: Diona Fanti A on: 06/26/2017 02:28 PM   Modules accepted: Orders

## 2017-06-26 NOTE — Progress Notes (Signed)
Pt here for depo shot 150 mg IM given lt ventrogluteal. Tolerated well. Return 12 week. Pad CMA

## 2017-08-11 ENCOUNTER — Telehealth: Payer: Self-pay | Admitting: Adult Health

## 2017-08-11 NOTE — Telephone Encounter (Signed)
Patient's grandmother called stating that her granddaughter Kendra Schneider will run out before her next Kendra Schneider is due. Pt's grandmother would like jennifer to call in a refill of the Kendra Schneider.

## 2017-08-11 NOTE — Telephone Encounter (Signed)
Grandmother states there are not ay refills left on depo injection.  Spoke to pharmacy who stated she did have refill left.

## 2017-09-11 ENCOUNTER — Ambulatory Visit (INDEPENDENT_AMBULATORY_CARE_PROVIDER_SITE_OTHER): Payer: Medicaid Other | Admitting: *Deleted

## 2017-09-11 ENCOUNTER — Encounter: Payer: Self-pay | Admitting: *Deleted

## 2017-09-11 ENCOUNTER — Other Ambulatory Visit: Payer: Self-pay

## 2017-09-11 DIAGNOSIS — Z3042 Encounter for surveillance of injectable contraceptive: Secondary | ICD-10-CM

## 2017-09-11 DIAGNOSIS — Z3202 Encounter for pregnancy test, result negative: Secondary | ICD-10-CM

## 2017-09-11 LAB — POCT URINE PREGNANCY: Preg Test, Ur: NEGATIVE

## 2017-09-11 MED ORDER — MEDROXYPROGESTERONE ACETATE 150 MG/ML IM SUSP
150.0000 mg | Freq: Once | INTRAMUSCULAR | Status: AC
Start: 2017-09-11 — End: 2017-09-11
  Administered 2017-09-11: 150 mg via INTRAMUSCULAR

## 2017-12-04 ENCOUNTER — Ambulatory Visit (INDEPENDENT_AMBULATORY_CARE_PROVIDER_SITE_OTHER): Payer: Medicaid Other | Admitting: *Deleted

## 2017-12-04 ENCOUNTER — Encounter: Payer: Self-pay | Admitting: *Deleted

## 2017-12-04 ENCOUNTER — Encounter (INDEPENDENT_AMBULATORY_CARE_PROVIDER_SITE_OTHER): Payer: Self-pay

## 2017-12-04 ENCOUNTER — Other Ambulatory Visit: Payer: Self-pay

## 2017-12-04 DIAGNOSIS — Z3202 Encounter for pregnancy test, result negative: Secondary | ICD-10-CM | POA: Diagnosis not present

## 2017-12-04 DIAGNOSIS — Z3042 Encounter for surveillance of injectable contraceptive: Secondary | ICD-10-CM | POA: Diagnosis not present

## 2017-12-04 LAB — POCT URINE PREGNANCY: PREG TEST UR: NEGATIVE

## 2017-12-04 MED ORDER — MEDROXYPROGESTERONE ACETATE 150 MG/ML IM SUSP
150.0000 mg | Freq: Once | INTRAMUSCULAR | Status: AC
  Administered 2017-12-04: 150 mg via INTRAMUSCULAR

## 2017-12-04 NOTE — Progress Notes (Signed)
Pt given DepoProvera 150mg  IM left VG without complications. Advised to return in 12 weeks for next injection.

## 2018-02-19 ENCOUNTER — Ambulatory Visit: Payer: Medicaid Other

## 2018-02-23 ENCOUNTER — Ambulatory Visit (INDEPENDENT_AMBULATORY_CARE_PROVIDER_SITE_OTHER): Payer: Medicaid Other

## 2018-02-23 VITALS — Ht 59.0 in | Wt 238.0 lb

## 2018-02-23 DIAGNOSIS — Z3202 Encounter for pregnancy test, result negative: Secondary | ICD-10-CM

## 2018-02-23 DIAGNOSIS — Z3042 Encounter for surveillance of injectable contraceptive: Secondary | ICD-10-CM | POA: Diagnosis not present

## 2018-02-23 LAB — POCT URINE PREGNANCY: PREG TEST UR: NEGATIVE

## 2018-02-23 MED ORDER — MEDROXYPROGESTERONE ACETATE 150 MG/ML IM SUSP
150.0000 mg | Freq: Once | INTRAMUSCULAR | Status: AC
  Administered 2018-02-23: 150 mg via INTRAMUSCULAR

## 2018-02-23 NOTE — Progress Notes (Signed)
Pt here for depo injection 150 mg IM given rt VG. Tolerated well. Return 12 weeks for next injection. Pad CMA

## 2018-05-14 ENCOUNTER — Other Ambulatory Visit: Payer: Self-pay

## 2018-05-14 ENCOUNTER — Ambulatory Visit (INDEPENDENT_AMBULATORY_CARE_PROVIDER_SITE_OTHER): Payer: Medicaid Other | Admitting: *Deleted

## 2018-05-14 DIAGNOSIS — Z3042 Encounter for surveillance of injectable contraceptive: Secondary | ICD-10-CM | POA: Diagnosis not present

## 2018-05-14 MED ORDER — MEDROXYPROGESTERONE ACETATE 150 MG/ML IM SUSP
150.0000 mg | Freq: Once | INTRAMUSCULAR | Status: AC
  Administered 2018-05-14: 12:00:00 150 mg via INTRAMUSCULAR

## 2018-05-14 NOTE — Progress Notes (Signed)
Depo Provera 150mg IM given in right deltoid with no complications. Pt to return in 12 weeks for next injection.  

## 2018-06-12 ENCOUNTER — Other Ambulatory Visit: Payer: Self-pay | Admitting: Adult Health

## 2018-06-16 ENCOUNTER — Telehealth: Payer: Self-pay | Admitting: Adult Health

## 2018-06-16 NOTE — Telephone Encounter (Signed)
Patient's mom called, wants to make sure we received the depo request from the pharmacy.  [phone]

## 2018-08-05 ENCOUNTER — Telehealth: Payer: Self-pay | Admitting: *Deleted

## 2018-08-05 NOTE — Telephone Encounter (Signed)
I called patient no answer no voicemail

## 2018-08-06 ENCOUNTER — Other Ambulatory Visit: Payer: Self-pay

## 2018-08-06 ENCOUNTER — Ambulatory Visit (INDEPENDENT_AMBULATORY_CARE_PROVIDER_SITE_OTHER): Payer: Medicaid Other | Admitting: *Deleted

## 2018-08-06 DIAGNOSIS — Z3042 Encounter for surveillance of injectable contraceptive: Secondary | ICD-10-CM | POA: Diagnosis not present

## 2018-08-06 MED ORDER — MEDROXYPROGESTERONE ACETATE 150 MG/ML IM SUSP
150.0000 mg | Freq: Once | INTRAMUSCULAR | Status: AC
  Administered 2018-08-06: 150 mg via INTRAMUSCULAR

## 2018-08-06 NOTE — Progress Notes (Signed)
Depo Provera 150 mg given IM in right deltoid. Patient tolerated well.  °

## 2018-10-22 ENCOUNTER — Ambulatory Visit: Payer: Medicaid Other

## 2018-10-28 ENCOUNTER — Telehealth: Payer: Self-pay | Admitting: Obstetrics & Gynecology

## 2018-10-28 NOTE — Telephone Encounter (Signed)

## 2018-10-29 ENCOUNTER — Other Ambulatory Visit: Payer: Self-pay

## 2018-10-29 ENCOUNTER — Ambulatory Visit (INDEPENDENT_AMBULATORY_CARE_PROVIDER_SITE_OTHER): Payer: Medicaid Other

## 2018-10-29 DIAGNOSIS — Z3042 Encounter for surveillance of injectable contraceptive: Secondary | ICD-10-CM

## 2018-10-29 MED ORDER — MEDROXYPROGESTERONE ACETATE 150 MG/ML IM SUSP
150.0000 mg | Freq: Once | INTRAMUSCULAR | Status: AC
  Administered 2018-10-29: 150 mg via INTRAMUSCULAR

## 2018-10-29 NOTE — Progress Notes (Signed)
   NURSE VISIT- INJECTION  SUBJECTIVE:  Kendra Schneider is a 33 y.o. G0P0 female here for a Depo Provera for contraception/period management. She is a GYN patient.   OBJECTIVE:  There were no vitals taken for this visit.  Appears well, in no apparent distress  Injection administered in: Left deltoid  Meds ordered this encounter  Medications  . medroxyPROGESTERone (DEPO-PROVERA) injection 150 mg    ASSESSMENT: GYN patient Depo Provera for contraception/period management  PLAN: Follow-up: in 11-13 weeks for next Depo   Ladonna Snide  10/29/2018 12:27 PM

## 2018-12-28 ENCOUNTER — Other Ambulatory Visit (HOSPITAL_COMMUNITY): Payer: Self-pay | Admitting: Pulmonary Disease

## 2018-12-28 ENCOUNTER — Other Ambulatory Visit: Payer: Self-pay | Admitting: Pulmonary Disease

## 2018-12-28 DIAGNOSIS — R112 Nausea with vomiting, unspecified: Secondary | ICD-10-CM

## 2019-01-04 ENCOUNTER — Telehealth: Payer: Self-pay | Admitting: Pulmonary Disease

## 2019-01-05 ENCOUNTER — Encounter (HOSPITAL_COMMUNITY): Payer: Self-pay

## 2019-01-05 ENCOUNTER — Ambulatory Visit (HOSPITAL_COMMUNITY): Payer: Medicaid Other

## 2019-01-10 ENCOUNTER — Ambulatory Visit (HOSPITAL_COMMUNITY): Payer: Medicaid Other

## 2019-01-25 ENCOUNTER — Telehealth: Payer: Self-pay | Admitting: Obstetrics & Gynecology

## 2019-01-25 NOTE — Telephone Encounter (Signed)
Called patient regarding appointment and the following message was left:   We have you scheduled for an upcoming appointment at our office. At this time, we are still not allowing visitors or children during the appointment, however, a support person, over age 35, may accompany you to your appointment if assistance is needed for safety or care concerns. Otherwise, support persons should remain outside until the visit is complete.   We ask if you have had any exposure to anyone suspected or confirmed of having COVID-19, are awaiting test results for COVID-19 or if you are experiencing any of the following, to call and reschedule your appointment: fever, cough, shortness of breath, muscle pain, diarrhea, rash, vomiting, abdominal pain, red eye, weakness, bruising, bleeding, joint pain, or a severe headache.   Please know we will ask you these questions or similar questions when you arrive for your appointment and again it's how we are keeping everyone safe.    Also,to keep you safe, please use the provided hand sanitizer when you enter the office. We are asking everyone in the office to wear a mask to help prevent the spread of germs. If you have a mask of your own, please wear it to your appointment, if not, we are happy to provide one for you.  Thank you for understanding and your cooperation.    [facility] Staff      

## 2019-01-26 ENCOUNTER — Other Ambulatory Visit: Payer: Self-pay

## 2019-01-26 ENCOUNTER — Ambulatory Visit (INDEPENDENT_AMBULATORY_CARE_PROVIDER_SITE_OTHER): Payer: Medicaid Other

## 2019-01-26 DIAGNOSIS — Z3042 Encounter for surveillance of injectable contraceptive: Secondary | ICD-10-CM | POA: Diagnosis not present

## 2019-01-26 MED ORDER — MEDROXYPROGESTERONE ACETATE 150 MG/ML IM SUSP
150.0000 mg | Freq: Once | INTRAMUSCULAR | Status: AC
  Administered 2019-01-26: 150 mg via INTRAMUSCULAR

## 2019-01-26 NOTE — Progress Notes (Signed)
   NURSE VISIT- INJECTION  SUBJECTIVE:  Kendra Schneider is a 33 y.o. G0P0 female here for a Depo Provera for contraception/period management. She is a GYN patient.   OBJECTIVE:  There were no vitals taken for this visit.  Appears well, in no apparent distress  Injection administered in: Right deltoid  Meds ordered this encounter  Medications  . medroxyPROGESTERone (DEPO-PROVERA) injection 150 mg    ASSESSMENT: GYN patient Depo Provera for contraception/period management PLAN: Follow-up: in 11-13 weeks for next Depo   Ladonna Snide  01/26/2019 3:16 PM

## 2019-01-29 DIAGNOSIS — F329 Major depressive disorder, single episode, unspecified: Secondary | ICD-10-CM | POA: Insufficient documentation

## 2019-01-29 DIAGNOSIS — N3281 Overactive bladder: Secondary | ICD-10-CM | POA: Insufficient documentation

## 2019-01-29 DIAGNOSIS — F419 Anxiety disorder, unspecified: Secondary | ICD-10-CM

## 2019-01-29 DIAGNOSIS — R519 Headache, unspecified: Secondary | ICD-10-CM | POA: Insufficient documentation

## 2019-01-29 DIAGNOSIS — E669 Obesity, unspecified: Secondary | ICD-10-CM | POA: Insufficient documentation

## 2019-01-29 DIAGNOSIS — E069 Thyroiditis, unspecified: Secondary | ICD-10-CM

## 2019-01-29 DIAGNOSIS — L0292 Furuncle, unspecified: Secondary | ICD-10-CM

## 2019-01-29 DIAGNOSIS — R61 Generalized hyperhidrosis: Secondary | ICD-10-CM | POA: Insufficient documentation

## 2019-01-29 DIAGNOSIS — J45909 Unspecified asthma, uncomplicated: Secondary | ICD-10-CM | POA: Insufficient documentation

## 2019-01-29 DIAGNOSIS — L509 Urticaria, unspecified: Secondary | ICD-10-CM | POA: Insufficient documentation

## 2019-01-29 DIAGNOSIS — K2100 Gastro-esophageal reflux disease with esophagitis, without bleeding: Secondary | ICD-10-CM

## 2019-01-29 DIAGNOSIS — J309 Allergic rhinitis, unspecified: Secondary | ICD-10-CM

## 2019-02-21 ENCOUNTER — Other Ambulatory Visit: Payer: Self-pay

## 2019-02-21 ENCOUNTER — Ambulatory Visit: Payer: Medicaid Other | Admitting: Neurology

## 2019-02-21 ENCOUNTER — Encounter: Payer: Self-pay | Admitting: Neurology

## 2019-02-21 VITALS — BP 124/88 | HR 89 | Temp 97.6°F | Ht 60.0 in | Wt 240.0 lb

## 2019-02-21 DIAGNOSIS — G8929 Other chronic pain: Secondary | ICD-10-CM

## 2019-02-21 DIAGNOSIS — R51 Headache with orthostatic component, not elsewhere classified: Secondary | ICD-10-CM | POA: Diagnosis not present

## 2019-02-21 DIAGNOSIS — R519 Headache, unspecified: Secondary | ICD-10-CM

## 2019-02-21 DIAGNOSIS — H539 Unspecified visual disturbance: Secondary | ICD-10-CM

## 2019-02-21 DIAGNOSIS — G43711 Chronic migraine without aura, intractable, with status migrainosus: Secondary | ICD-10-CM | POA: Diagnosis not present

## 2019-02-21 DIAGNOSIS — G441 Vascular headache, not elsewhere classified: Secondary | ICD-10-CM

## 2019-02-21 MED ORDER — EMGALITY 120 MG/ML ~~LOC~~ SOAJ: 120.0000 mg | SUBCUTANEOUS | 11 refills | Status: DC

## 2019-02-21 MED ORDER — RIZATRIPTAN BENZOATE 10 MG PO TBDP: 10.0000 mg | ORAL_TABLET | ORAL | 11 refills | Status: DC | PRN

## 2019-02-21 MED ORDER — ONDANSETRON 4 MG PO TBDP: 4.0000 mg | ORAL_TABLET | Freq: Three times a day (TID) | ORAL | 3 refills | Status: DC | PRN
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
## 2019-02-21 NOTE — Patient Instructions (Addendum)
At onset of migraine: Rizatriptan.  Please take one tablet at the onset of your headache. If it does not improve the symptoms please take one additional tablet. Do not take more then 2 tablets in 24hrs. Do not take use more then 2 to 3 times in a week. For nausea or migraines: Ondansetron (May take with Rizatriptan) Monthly preventative: Emgality(Galcanezumab)  Galcanezumab injection What is this medicine? GALCANEZUMAB (gal ka NEZ ue mab) is used to prevent migraines and treat cluster headaches. This medicine may be used for other purposes; ask your health care provider or pharmacist if you have questions. COMMON BRAND NAME(S): Emgality What should I tell my health care provider before I take this medicine? They need to know if you have any of these conditions:  an unusual or allergic reaction to galcanezumab, other medicines, foods, dyes, or preservatives  pregnant or trying to get pregnant  breast-feeding How should I use this medicine? This medicine is for injection under the skin. You will be taught how to prepare and give this medicine. Use exactly as directed. Take your medicine at regular intervals. Do not take your medicine more often than directed. It is important that you put your used needles and syringes in a special sharps container. Do not put them in a trash can. If you do not have a sharps container, call your pharmacist or healthcare provider to get one. Talk to your pediatrician regarding the use of this medicine in children. Special care may be needed. Overdosage: If you think you have taken too much of this medicine contact a poison control center or emergency room at once. NOTE: This medicine is only for you. Do not share this medicine with others. What if I miss a dose? If you miss a dose, take it as soon as you can. If it is almost time for your next dose, take only that dose. Do not take double or extra doses. What may interact with this medicine? Interactions are  not expected. This list may not describe all possible interactions. Give your health care provider a list of all the medicines, herbs, non-prescription drugs, or dietary supplements you use. Also tell them if you smoke, drink alcohol, or use illegal drugs. Some items may interact with your medicine. What should I watch for while using this medicine? Tell your doctor or healthcare professional if your symptoms do not start to get better or if they get worse. What side effects may I notice from receiving this medicine? Side effects that you should report to your doctor or health care professional as soon as possible:  allergic reactions like skin rash, itching or hives, swelling of the face, lips, or tongue Side effects that usually do not require medical attention (report these to your doctor or health care professional if they continue or are bothersome):  pain, redness, or irritation at site where injected This list may not describe all possible side effects. Call your doctor for medical advice about side effects. You may report side effects to FDA at 1-800-FDA-1088. Where should I keep my medicine? Keep out of the reach of children. You will be instructed on how to store this medicine. Throw away any unused medicine after the expiration date on the label. NOTE: This sheet is a summary. It may not cover all possible information. If you have questions about this medicine, talk to your doctor, pharmacist, or health care provider.  2020 Elsevier/Gold Standard (2017-07-01 12:03:23) Ondansetron oral dissolving tablet What is this medicine? ONDANSETRON (on DAN  se tron) is used to treat nausea and vomiting caused by chemotherapy. It is also used to prevent or treat nausea and vomiting after surgery. This medicine may be used for other purposes; ask your health care provider or pharmacist if you have questions. COMMON BRAND NAME(S): Zofran ODT What should I tell my health care provider before I take  this medicine? They need to know if you have any of these conditions:  heart disease  history of irregular heartbeat  liver disease  low levels of magnesium or potassium in the blood  an unusual or allergic reaction to ondansetron, granisetron, other medicines, foods, dyes, or preservatives  pregnant or trying to get pregnant  breast-feeding How should I use this medicine? These tablets are made to dissolve in the mouth. Do not try to push the tablet through the foil backing. With dry hands, peel away the foil backing and gently remove the tablet. Place the tablet in the mouth and allow it to dissolve, then swallow. While you may take these tablets with water, it is not necessary to do so. Talk to your pediatrician regarding the use of this medicine in children. Special care may be needed. Overdosage: If you think you have taken too much of this medicine contact a poison control center or emergency room at once. NOTE: This medicine is only for you. Do not share this medicine with others. What if I miss a dose? If you miss a dose, take it as soon as you can. If it is almost time for your next dose, take only that dose. Do not take double or extra doses. What may interact with this medicine? Do not take this medicine with any of the following medications:  apomorphine  certain medicines for fungal infections like fluconazole, itraconazole, ketoconazole, posaconazole, voriconazole  cisapride  dronedarone  pimozide  thioridazine This medicine may also interact with the following medications:  carbamazepine  certain medicines for depression, anxiety, or psychotic disturbances  fentanyl  linezolid  MAOIs like Carbex, Eldepryl, Marplan, Nardil, and Parnate  methylene blue (injected into a vein)  other medicines that prolong the QT interval (cause an abnormal heart rhythm) like dofetilide, ziprasidone  phenytoin  rifampicin  tramadol This list may not describe all  possible interactions. Give your health care provider a list of all the medicines, herbs, non-prescription drugs, or dietary supplements you use. Also tell them if you smoke, drink alcohol, or use illegal drugs. Some items may interact with your medicine. What should I watch for while using this medicine? Check with your doctor or health care professional as soon as you can if you have any sign of an allergic reaction. What side effects may I notice from receiving this medicine? Side effects that you should report to your doctor or health care professional as soon as possible:  allergic reactions like skin rash, itching or hives, swelling of the face, lips, or tongue  breathing problems  confusion  dizziness  fast or irregular heartbeat  feeling faint or lightheaded, falls  fever and chills  loss of balance or coordination  seizures  sweating  swelling of the hands and feet  tightness in the chest  tremors  unusually weak or tired Side effects that usually do not require medical attention (report to your doctor or health care professional if they continue or are bothersome):  constipation or diarrhea  headache This list may not describe all possible side effects. Call your doctor for medical advice about side effects. You may report  side effects to FDA at 1-800-FDA-1088. Where should I keep my medicine? Keep out of the reach of children. Store between 2 and 30 degrees C (36 and 86 degrees F). Throw away any unused medicine after the expiration date. NOTE: This sheet is a summary. It may not cover all possible information. If you have questions about this medicine, talk to your doctor, pharmacist, or health care provider.  2020 Elsevier/Gold Standard (2018-01-05 07:14:10) Rizatriptan disintegrating tablets What is this medicine? RIZATRIPTAN (rye za TRIP tan) is used to treat migraines with or without aura. An aura is a strange feeling or visual disturbance that warns you  of an attack. It is not used to prevent migraines. This medicine may be used for other purposes; ask your health care provider or pharmacist if you have questions. COMMON BRAND NAME(S): Maxalt-MLT What should I tell my health care provider before I take this medicine? They need to know if you have any of these conditions:  cigarette smoker  circulation problems in fingers and toes  diabetes  heart disease  high blood pressure  high cholesterol  history of irregular heartbeat  history of stroke  kidney disease  liver disease  stomach or intestine problems  an unusual or allergic reaction to rizatriptan, other medicines, foods, dyes, or preservatives  pregnant or trying to get pregnant  breast-feeding How should I use this medicine? Take this medicine by mouth. Follow the directions on the prescription label. Leave the tablet in the sealed blister pack until you are ready to take it. With dry hands, open the blister and gently remove the tablet. If the tablet breaks or crumbles, throw it away and take a new tablet out of the blister pack. Place the tablet in the mouth and allow it to dissolve, and then swallow. Do not cut, crush, or chew this medicine. You do not need water to take this medicine. Do not take it more often than directed. Talk to your pediatrician regarding the use of this medicine in children. While this drug may be prescribed for children as young as 6 years for selected conditions, precautions do apply. Overdosage: If you think you have taken too much of this medicine contact a poison control center or emergency room at once. NOTE: This medicine is only for you. Do not share this medicine with others. What if I miss a dose? This does not apply. This medicine is not for regular use. What may interact with this medicine? Do not take this medicine with any of the following medicines:  certain medicines for migraine headache like almotriptan, eletriptan,  frovatriptan, naratriptan, rizatriptan, sumatriptan, zolmitriptan  ergot alkaloids like dihydroergotamine, ergonovine, ergotamine, methylergonovine  MAOIs like Carbex, Eldepryl, Marplan, Nardil, and Parnate This medicine may also interact with the following medications:  certain medicines for depression, anxiety, or psychotic disorders  propranolol This list may not describe all possible interactions. Give your health care provider a list of all the medicines, herbs, non-prescription drugs, or dietary supplements you use. Also tell them if you smoke, drink alcohol, or use illegal drugs. Some items may interact with your medicine. What should I watch for while using this medicine? Visit your healthcare professional for regular checks on your progress. Tell your healthcare professional if your symptoms do not start to get better or if they get worse. You may get drowsy or dizzy. Do not drive, use machinery, or do anything that needs mental alertness until you know how this medicine affects you. Do not stand up or  sit up quickly, especially if you are an older patient. This reduces the risk of dizzy or fainting spells. Alcohol may interfere with the effect of this medicine. Your mouth may get dry. Chewing sugarless gum or sucking hard candy and drinking plenty of water may help. Contact your healthcare professional if the problem does not go away or is severe. If you take migraine medicines for 10 or more days a month, your migraines may get worse. Keep a diary of headache days and medicine use. Contact your healthcare professional if your migraine attacks occur more frequently. What side effects may I notice from receiving this medicine? Side effects that you should report to your doctor or health care professional as soon as possible:  allergic reactions like skin rash, itching or hives, swelling of the face, lips, or tongue  chest pain or chest tightness  signs and symptoms of a dangerous  change in heartbeat or heart rhythm like chest pain; dizziness; fast, irregular heartbeat; palpitations; feeling faint or lightheaded; falls; breathing problems  signs and symptoms of a stroke like changes in vision; confusion; trouble speaking or understanding; severe headaches; sudden numbness or weakness of the face, arm or leg; trouble walking; dizziness; loss of balance or coordination  signs and symptoms of serotonin syndrome like irritable; confusion; diarrhea; fast or irregular heartbeat; muscle twitching; stiff muscles; trouble walking; sweating; high fever; seizures; chills; vomiting Side effects that usually do not require medical attention (report to your doctor or health care professional if they continue or are bothersome):  diarrhea  dizziness  drowsiness  dry mouth  headache  nausea, vomiting  pain, tingling, numbness in the hands or feet  stomach pain This list may not describe all possible side effects. Call your doctor for medical advice about side effects. You may report side effects to FDA at 1-800-FDA-1088. Where should I keep my medicine? Keep out of the reach of children. Store at room temperature between 15 and 30 degrees C (59 and 86 degrees F). Protect from light and moisture. Throw away any unused medicine after the expiration date. NOTE: This sheet is a summary. It may not cover all possible information. If you have questions about this medicine, talk to your doctor, pharmacist, or health care provider.  2020 Elsevier/Gold Standard (2017-07-28 14:58:08)

## 2019-02-22 ENCOUNTER — Encounter: Payer: Self-pay | Admitting: Neurology

## 2019-02-22 DIAGNOSIS — G43711 Chronic migraine without aura, intractable, with status migrainosus: Secondary | ICD-10-CM | POA: Insufficient documentation

## 2019-02-22 LAB — CBC
Hematocrit: 45.8 % (ref 34.0–46.6)
Hemoglobin: 14.8 g/dL (ref 11.1–15.9)
MCH: 26.3 pg — ABNORMAL LOW (ref 26.6–33.0)
MCHC: 32.3 g/dL (ref 31.5–35.7)
MCV: 81 fL (ref 79–97)
Platelets: 270 10*3/uL (ref 150–450)
RBC: 5.63 x10E6/uL — ABNORMAL HIGH (ref 3.77–5.28)
RDW: 14.2 % (ref 11.7–15.4)
WBC: 13.8 10*3/uL — ABNORMAL HIGH (ref 3.4–10.8)

## 2019-02-22 LAB — COMPREHENSIVE METABOLIC PANEL
ALT: 18 IU/L (ref 0–32)
AST: 16 IU/L (ref 0–40)
Albumin/Globulin Ratio: 1.5 (ref 1.2–2.2)
Albumin: 4.5 g/dL (ref 3.8–4.8)
Alkaline Phosphatase: 129 IU/L — ABNORMAL HIGH (ref 39–117)
BUN/Creatinine Ratio: 14 (ref 9–23)
BUN: 12 mg/dL (ref 6–20)
Bilirubin Total: <0.2 mg/dL (ref 0.0–1.2)
CO2: 20 mmol/L (ref 20–29)
Calcium: 9.3 mg/dL (ref 8.7–10.2)
Chloride: 104 mmol/L (ref 96–106)
Creatinine, Ser: 0.83 mg/dL (ref 0.57–1.00)
GFR calc Af Amer: 106 mL/min/{1.73_m2} (ref >59)
GFR calc non Af Amer: 92 mL/min/{1.73_m2} (ref >59)
Globulin, Total: 3 g/dL (ref 1.5–4.5)
Glucose: 77 mg/dL (ref 65–99)
Potassium: 4 mmol/L (ref 3.5–5.2)
Sodium: 143 mmol/L (ref 134–144)
Total Protein: 7.5 g/dL (ref 6.0–8.5)

## 2019-02-22 LAB — PREGNANCY, URINE: Preg Test, Ur: NEGATIVE

## 2019-02-22 LAB — TSH: TSH: 3.58 u[IU]/mL (ref 0.450–4.500)

## 2019-02-22 NOTE — Progress Notes (Signed)
Labs unremarkable. Her white blood cells are a little elevated but I see some steroids on her med list and that can artificially increase WBCs. She did not appear ill when I saw her so this may be incidental if she starts having symptoms of illness (fever, chills etc) contact pcp but I'm not too concerned thanks

## 2019-02-23 ENCOUNTER — Telehealth: Payer: Self-pay | Admitting: Neurology

## 2019-02-23 MED ORDER — RIZATRIPTAN BENZOATE 10 MG PO TBDP: 10.0000 mg | ORAL_TABLET | ORAL | 11 refills | Status: AC | PRN

## 2019-02-23 MED ORDER — ONDANSETRON 4 MG PO TBDP: 4.0000 mg | ORAL_TABLET | Freq: Three times a day (TID) | ORAL | 3 refills | Status: DC | PRN

## 2019-02-23 NOTE — Telephone Encounter (Signed)
Reviewed pt's message with Dr. Jaynee Eagles. No knowledge of Emgality causing these symptoms. Dr. Jaynee Eagles had prescribed Zofran for the pt. Will encourage her to use this.   I called the pt and discussed Dr. Cathren Laine message above. Pt had not filled the Zofran yet. I encouraged her to try this. Pt aware of the instructions. I encouraged her to call back if needed and also advised if her symptoms worsen overnight she should go to the emergency department. Patient asked If she should stay on the injection or stop it and I encouraged her not to stop it for now as it's a once a month injection and the goal is for it to help her migraines. Patient also advised the Rizatriptan was prescribed for headache as well if she needs that but I encouraged her to try the Zofran first to try to help with the vomiting/dizziness. She verbalized understanding and appreciation.   I spoke with pt once more after the phone conversation ended. She stated [facility] had not gotten the prescriptions. I re-sent these. Confirmed receipt.

## 2019-02-23 NOTE — Telephone Encounter (Signed)
Pt called stating that the injections she just received are making her vomit and she has also experienced dizziness. Pt states they are not agreeing with her and would like to know what she can do. Please advise.

## 2019-02-24 ENCOUNTER — Telehealth: Payer: Self-pay | Admitting: *Deleted

## 2019-02-24 NOTE — Telephone Encounter (Signed)
-----   Message from Melvenia Beam, MD sent at 02/22/2019  5:50 PM EST ----- Labs unremarkable. Her white blood cells are a little elevated but I see some steroids on her med list and that can artificially increase WBCs. She did not appear ill when I saw her so this may be incidental if she starts having symptoms of illness (fever, chills etc) contact pcp but I'm not too concerned thanks

## 2019-02-24 NOTE — Telephone Encounter (Signed)
Called pt & discussed  Lab results. She verbalized understanding and appreciation for the call. Pt feeling better today.

## 2019-03-22 ENCOUNTER — Telehealth: Payer: Self-pay | Admitting: *Deleted

## 2019-03-22 NOTE — Telephone Encounter (Signed)
Received Rizatriptan 10 mg PA. I called [facility]. Pt mother evidently had requested more from pharmacy for pt but they are filling only 9 tablets per 30 days. Drug on PDL on medicaid website. I let pharmacy know the physician only orders 9/month anyway and if pt needs more she needs to call us. No PA needed.

## 2019-03-24 ENCOUNTER — Ambulatory Visit
Admission: RE | Admit: 2019-03-24 | Discharge: 2019-03-24 | Disposition: A | Discharge status: Home / Self Care | Payer: Medicaid Other | Source: Ambulatory Visit | Attending: Neurology | Admitting: Neurology

## 2019-03-24 ENCOUNTER — Other Ambulatory Visit: Payer: Self-pay

## 2019-03-24 DIAGNOSIS — G441 Vascular headache, not elsewhere classified: Secondary | ICD-10-CM

## 2019-03-24 DIAGNOSIS — G8929 Other chronic pain: Secondary | ICD-10-CM

## 2019-03-24 DIAGNOSIS — R519 Headache, unspecified: Secondary | ICD-10-CM

## 2019-03-24 DIAGNOSIS — R51 Headache with orthostatic component, not elsewhere classified: Secondary | ICD-10-CM

## 2019-03-24 DIAGNOSIS — H539 Unspecified visual disturbance: Secondary | ICD-10-CM

## 2019-03-24 MED ORDER — GADOBENATE DIMEGLUMINE 529 MG/ML IV SOLN
20.0000 mL | Freq: Once | INTRAVENOUS | Status: AC | PRN
  Administered 2019-03-24: 20 mL via INTRAVENOUS

## 2019-03-25 ENCOUNTER — Telehealth: Payer: Self-pay | Admitting: Neurology

## 2019-03-25 ENCOUNTER — Other Ambulatory Visit: Payer: Self-pay | Admitting: Neurology

## 2019-03-25 MED ORDER — PREDNISONE 20 MG PO TABS: 60.0000 mg | ORAL_TABLET | Freq: Every day | ORAL | 0 refills | Status: DC

## 2019-03-25 NOTE — Telephone Encounter (Signed)
I spoke with both patient and her aunt, both very lovely and pleasant people.  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  I discussed with patient first who I think had a minimal understanding of the situation.  I also discussed with aunt who appears to have a low level of education and I spent some time explaining to her what a meningioma is, providing visual examples of how large this might be, trying to reassure her that this is outside the brain but it is pushing down on the brain and can be causing the significant headaches and the nausea that patient is complaining about, and that she is being referred to an excellent and kind neurosurgeon.  I do think that aunt received a good understanding of what is going on and understands that surgery is needed. She is awar that neurosurgery will be calling them for an appointment, they screen calls but will pick up as soon as they hear it is neurosurgery.  Aunt is very much in agreement to go forward with neurosurgical evaluation and surgery if in the best interests of the patine.  I will be prescribing prednisone for the patient, 60 mg every morning, to see if we can help with her headaches and nausea until she can see Dr. Zada Finders.  I very much appreciate my colleague Dr. Venetia Constable for always being available to discuss cases and taking such great care of our patients.

## 2019-04-13 ENCOUNTER — Ambulatory Visit: Payer: Medicaid Other

## 2019-04-21 ENCOUNTER — Telehealth: Payer: Self-pay | Admitting: Obstetrics & Gynecology

## 2019-04-21 NOTE — Telephone Encounter (Signed)

## 2019-04-22 ENCOUNTER — Other Ambulatory Visit: Payer: Self-pay | Admitting: Adult Health

## 2019-04-25 ENCOUNTER — Other Ambulatory Visit: Payer: Self-pay

## 2019-04-25 ENCOUNTER — Ambulatory Visit (INDEPENDENT_AMBULATORY_CARE_PROVIDER_SITE_OTHER): Payer: Medicaid Other

## 2019-04-25 VITALS — Ht 59.0 in | Wt 235.4 lb

## 2019-04-25 DIAGNOSIS — Z3042 Encounter for surveillance of injectable contraceptive: Secondary | ICD-10-CM | POA: Diagnosis not present

## 2019-04-25 MED ORDER — MEDROXYPROGESTERONE ACETATE 150 MG/ML IM SUSP
150.0000 mg | Freq: Once | INTRAMUSCULAR | Status: AC
  Administered 2019-04-25: 15:00:00 150 mg via INTRAMUSCULAR

## 2019-04-25 NOTE — Progress Notes (Signed)
   NURSE VISIT- INJECTION  SUBJECTIVE:  Kendra Schneider is a 34 y.o. G0P0 female here for depo injection}. She is a GYN patient.   OBJECTIVE:  There were no vitals taken for this visit.  Appears well, in no apparent distress  Injection administered in: Left deltoid  No orders of the defined types were placed in this encounter.   ASSESSMENT: GYN patient Depo Provera for contraception/period management PLAN: Follow-up: in 11-13 weeks for next Depo   Ladonna Snide  04/25/2019 2:53 PM

## 2019-05-17 ENCOUNTER — Ambulatory Visit: Payer: Medicaid Other | Admitting: Family Medicine

## 2019-06-02 ENCOUNTER — Other Ambulatory Visit: Payer: Self-pay | Admitting: Neurological Surgery

## 2019-06-17 ENCOUNTER — Encounter (HOSPITAL_COMMUNITY): Payer: Self-pay

## 2019-06-17 ENCOUNTER — Other Ambulatory Visit (HOSPITAL_COMMUNITY)
Admission: RE | Admit: 2019-06-17 | Discharge: 2019-06-17 | Disposition: A | Discharge status: Home / Self Care | Payer: Medicaid Other | Source: Ambulatory Visit | Attending: Neurological Surgery | Admitting: Neurological Surgery

## 2019-06-17 ENCOUNTER — Other Ambulatory Visit: Payer: Self-pay

## 2019-06-17 ENCOUNTER — Encounter (HOSPITAL_COMMUNITY)
Admission: RE | Admit: 2019-06-17 | Discharge: 2019-06-17 | Disposition: A | Discharge status: Home / Self Care | Payer: Medicaid Other | Source: Ambulatory Visit | Attending: Neurological Surgery | Admitting: Neurological Surgery

## 2019-06-17 DIAGNOSIS — Z01812 Encounter for preprocedural laboratory examination: Secondary | ICD-10-CM | POA: Insufficient documentation

## 2019-06-17 DIAGNOSIS — Z20822 Contact with and (suspected) exposure to covid-19: Secondary | ICD-10-CM | POA: Diagnosis not present

## 2019-06-17 HISTORY — DX: Gastro-esophageal reflux disease without esophagitis: K21.9

## 2019-06-17 LAB — CBC
HCT: 45.5 % (ref 36.0–46.0)
Hemoglobin: 14.1 g/dL (ref 12.0–15.0)
MCH: 26.7 pg (ref 26.0–34.0)
MCHC: 31 g/dL (ref 30.0–36.0)
MCV: 86 fL (ref 80.0–100.0)
Platelets: 261 10*3/uL (ref 150–400)
RBC: 5.29 MIL/uL — ABNORMAL HIGH (ref 3.87–5.11)
RDW: 14.2 % (ref 11.5–15.5)
WBC: 12 10*3/uL — ABNORMAL HIGH (ref 4.0–10.5)
nRBC: 0 % (ref 0.0–0.2)

## 2019-06-17 LAB — TYPE AND SCREEN
ABO/RH(D): A POS
Antibody Screen: NEGATIVE

## 2019-06-17 LAB — ABO/RH: ABO/RH(D): A POS

## 2019-06-17 NOTE — Progress Notes (Signed)
[facility], [address] [place] [address] Phone: [phone] Fax: [phone]      Your procedure is scheduled on Monday, Jun 20, 2019 at 8:00 AM.  Report to [facility] Main Entrance "A" at 6:00 A.M., and check in at the Admitting office.  Call this number if you have problems the morning of surgery:  [phone]  Call [phone] if you have any questions prior to your surgery date Monday-Friday 8am-4pm    Remember:  Do not eat or drink after midnight the night before your surgery    Take these medicines the morning of surgery with A SIP OF WATER:  cetirizine (ZYRTEC) glycopyrrolate (ROBINUL)  omeprazole (PRILOSEC) sertraline (ZOLOFT)  Tiotropium Bromide Monohydrate (SPIRIVA RESPIMAT)   If needed: Acetaminophen (TYLENOL PO) albuterol (VENTOLIN HFA) diphenhydrAMINE (BENADRYL) EPINEPHrine Olopatadine HCl (PATADAY) 0.2 % SOLN ondansetron (ZOFRAN-ODT) rizatriptan (MAXALT-MLT)   Please bring all inhalers with you the day of surgery.   As of today, STOP taking any Aspirin (unless otherwise instructed by your surgeon) and Aspirin containing products, Aleve, Naproxen, Ibuprofen, Motrin, Advil, Goody's, BC's, all herbal medications, fish oil, and all vitamins.                      Do not wear jewelry, make up, or nail polish            Do not wear lotions, powders, perfumes, or deodorant.            Do not shave 48 hours prior to surgery.            Do not bring valuables to the hospital.            [facility] is not responsible for any belongings or valuables.  Do NOT Smoke (Tobacco/Vapping) or drink Alcohol 24 hours prior to your procedure If you use a CPAP at night, you may bring all equipment for your overnight stay.   Contacts, glasses, dentures or bridgework may not be worn into surgery.      For patients admitted to the hospital, discharge time will be determined by your treatment team.   Patients discharged  the day of surgery will not be allowed to drive home, and someone needs to stay with them for 24 hours.    Special instructions:   [place] [facility]- Preparing For Surgery  Before surgery, you can play an important role. Because skin is not sterile, your skin needs to be as free of germs as possible. You can reduce the number of germs on your skin by washing with CHG (chlorahexidine gluconate) Soap before surgery.  CHG is an antiseptic cleaner which kills germs and bonds with the skin to continue killing germs even after washing.    Oral Hygiene is also important to reduce your risk of infection.  Remember - BRUSH YOUR TEETH THE MORNING OF SURGERY WITH YOUR REGULAR TOOTHPASTE  Please do not use if you have an allergy to CHG or antibacterial soaps. If your skin becomes reddened/irritated stop using the CHG.  Do not shave (including legs and underarms) for at least 48 hours prior to first CHG shower. It is OK to shave your face.  Please follow these instructions carefully.   1. Shower the NIGHT BEFORE SURGERY and the MORNING OF SURGERY with CHG Soap.   2. If you chose to wash your hair, wash your hair first as usual with your normal shampoo.  3. After you shampoo, rinse  your hair and body thoroughly to remove the shampoo.  4. Use CHG as you would any other liquid soap. You can apply CHG directly to the skin and wash gently with a scrungie or a clean washcloth.   5. Apply the CHG Soap to your body ONLY FROM THE NECK DOWN.  Do not use on open wounds or open sores. Avoid contact with your eyes, ears, mouth and genitals (private parts). Wash Face and genitals (private parts)  with your normal soap.   6. Wash thoroughly, paying special attention to the area where your surgery will be performed.  7. Thoroughly rinse your body with warm water from the neck down.  8. DO NOT shower/wash with your normal soap after using and rinsing off the CHG Soap.  9. Pat yourself dry with a CLEAN TOWEL.  10. Wear  CLEAN PAJAMAS to bed the night before surgery, wear comfortable clothes the morning of surgery  11. Place CLEAN SHEETS on your bed the night of your first shower and DO NOT SLEEP WITH PETS.   Day of Surgery:   Do not apply any deodorants/lotions.  Please wear clean clothes to the hospital/surgery center.   Remember to brush your teeth WITH YOUR REGULAR TOOTHPASTE.   Please read over the following fact sheets that you were given.

## 2019-06-17 NOTE — Progress Notes (Signed)
PCP - Dr. Benny Lennert Cardiologist - Denies Pulmonologist: Dr. Sinda Du Neurologist: Dr. Sarina Ill  PPM/ICD - Denies  Chest x-ray - N/A EKG - N/A Stress Test - Denies ECHO - Denies Cardiac Cath - Denies  Sleep Study - Denies  Pt denies being diabetic.  Blood Thinner Instructions: N/A Aspirin Instructions: N/A  ERAS Protcol - No  COVID TEST- 06/17/19   Coronavirus Screening  Have you experienced the following symptoms:  Cough yes/no: No Fever (>100.9F)  yes/no: No Runny nose yes/no: No Sore throat yes/no: No Difficulty breathing/shortness of breath  yes/no: No  Have you or a family member traveled in the last 14 days and where? yes/no: No   If the patient indicates "YES" to the above questions, their PAT will be rescheduled to limit the exposure to others and, the surgeon will be notified. THE PATIENT WILL NEED TO BE ASYMPTOMATIC FOR 14 DAYS.   If the patient is not experiencing any of these symptoms, the PAT nurse will instruct them to NOT bring anyone with them to their appointment since they may have these symptoms or traveled as well.   Please remind your patients and families that hospital visitation restrictions are in effect and the importance of the restrictions.     Anesthesia review: Yes, mentally challenged  Patient denies shortness of breath, fever, cough and chest pain at PAT appointment   All instructions explained to the patient, with a verbal understanding of the material. Patient agrees to go over the instructions while at home for a better understanding. Patient also instructed to self quarantine after being tested for COVID-19. The opportunity to ask questions was provided.

## 2019-06-17 NOTE — Anesthesia Preprocedure Evaluation (Addendum)
Anesthesia Evaluation  Patient identified by MRN, date of birth, ID band Patient awake    Reviewed: Allergy & Precautions, NPO status , Patient's Chart, lab work & pertinent test results  History of Anesthesia Complications Negative for: history of anesthetic complications  Airway Mallampati: II  TM Distance: >3 FB Neck ROM: Full    Dental no notable dental hx. (+) Dental Advisory Given   Pulmonary asthma ,    Pulmonary exam normal        Cardiovascular negative cardio ROS Normal cardiovascular exam     Neuro/Psych PSYCHIATRIC DISORDERS Anxiety Depression Mentally challenged    GI/Hepatic Neg liver ROS, GERD  ,  Endo/Other  Morbid obesity  Renal/GU negative Renal ROS     Musculoskeletal negative musculoskeletal ROS (+)   Abdominal   Peds  Hematology negative hematology ROS (+)   Anesthesia Other Findings   Reproductive/Obstetrics                            Anesthesia Physical Anesthesia Plan  ASA: III  Anesthesia Plan: General   Post-op Pain Management:    Induction: Intravenous  PONV Risk Score and Plan: 4 or greater and Ondansetron, Dexamethasone, Diphenhydramine and Metaclopromide  Airway Management Planned: Oral ETT  Additional Equipment: Arterial line  Intra-op Plan:   Post-operative Plan: Extubation in OR  Informed Consent: I have reviewed the patients History and Physical, chart, labs and discussed the procedure including the risks, benefits and alternatives for the proposed anesthesia with the patient or authorized representative who has indicated his/her understanding and acceptance.     Dental advisory given  Plan Discussed with: Anesthesiologist and CRNA  Anesthesia Plan Comments: (See PAT note written 06/17/2019 by Myra Gianotti, PA-C. )      Anesthesia Quick Evaluation

## 2019-06-17 NOTE — Pre-Procedure Instructions (Signed)
Kendra Schneider  06/17/2019   Your procedure is scheduled on Monday, Jun 20, 2019 at 8:00 AM.   Report to [facility] Entrance "A" Admitting Office at 6:00 AM.   Call this number if you have problems the morning of surgery: [phone]   Remember:  Do not eat or drink after midnight Sunday, 06/19/19.  Take these medicines the morning of surgery with A SIP OF WATER:   Do not use NSAIDS (Ibuprofen, Aleve, etc), Aspirin containing products, Multivitamins, Fish Oil or Herbal medications prior to surgery.    Do not wear jewelry, make-up or nail polish.  Do not wear lotions, powders, perfumes or deodorant.  Do not shave 48 hours prior to surgery.    Do not bring valuables to the hospital.  [facility] is not responsible for any belongings or valuables.  Contacts, dentures or bridgework may not be worn into surgery.  Leave your suitcase in the car.  After surgery it may be brought to your room.  For patients admitted to the hospital, discharge time will be determined by your treatment team.  [facility] - Preparing for Surgery  Before surgery, you can play an important role.  Because skin is not sterile, your skin needs to be as free of germs as possible.  You can reduce the number of germs on you skin by washing with CHG (chlorahexidine gluconate) soap before surgery.  CHG is an antiseptic cleaner which kills germs and bonds with the skin to continue killing germs even after washing.  Oral Hygiene is also important in reducing the risk of infection.  Remember to brush your teeth with your regular toothpaste the morning of surgery.  Please DO NOT use if you have an allergy to CHG or antibacterial soaps.  If your skin becomes reddened/irritated stop using the CHG and inform your nurse when you arrive at Short Stay.  Do not shave (including legs and underarms) for at least 48 hours prior to the first CHG shower.  You may shave your face.  Please follow these instructions  carefully:   1.  Shower with CHG Soap the night before surgery and the morning of Surgery.  2.  If you choose to wash your hair, wash your hair first as usual with your normal shampoo.  3.  After you shampoo, rinse your hair and body thoroughly to remove the shampoo. 4.  Use CHG as you would any other liquid soap.  You can apply chg directly to the skin and wash gently with a      scrungie or washcloth.           5.  Apply the CHG Soap to your body ONLY FROM THE NECK DOWN.   Do not use on open wounds or open sores. Avoid contact with your eyes, ears, mouth and genitals (private parts).  Wash genitals (private parts) with your normal soap - do this prior to using CHG soap.  6.  Wash thoroughly, paying special attention to the area where your surgery will be performed.  7.  Thoroughly rinse your body with warm water from the neck down.  8.  DO NOT shower/wash with your normal soap after using and rinsing off the CHG Soap.  9.  Pat yourself dry with a clean towel.            10.  Wear clean pajamas.            11 .  Place clean sheets on your bed the night  of your first shower and do not sleep with pets.  Day of Surgery  Shower as above. Do not apply any lotions/deodorants the morning of surgery.   Please wear clean clothes to the hospital. Remember to brush your teeth with toothpaste.  Please read over the fact sheets that you were given.
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
## 2019-06-18 LAB — SARS CORONAVIRUS 2 (TAT 6-24 HRS): SARS Coronavirus 2: NEGATIVE

## 2019-06-20 ENCOUNTER — Other Ambulatory Visit: Payer: Self-pay

## 2019-06-20 ENCOUNTER — Inpatient Hospital Stay (HOSPITAL_COMMUNITY): Payer: Medicaid Other | Admitting: Anesthesiology

## 2019-06-20 ENCOUNTER — Encounter (HOSPITAL_COMMUNITY): Payer: Self-pay | Admitting: Neurological Surgery

## 2019-06-20 ENCOUNTER — Inpatient Hospital Stay (HOSPITAL_COMMUNITY): Payer: Medicaid Other | Admitting: Vascular Surgery

## 2019-06-20 ENCOUNTER — Inpatient Hospital Stay (HOSPITAL_COMMUNITY)
Admission: RE | Admit: 2019-06-20 | Discharge: 2019-06-24 | DRG: 026 | Disposition: A | Discharge status: Home / Self Care | Payer: Medicaid Other | Attending: Neurological Surgery | Admitting: Neurological Surgery

## 2019-06-20 ENCOUNTER — Encounter (HOSPITAL_COMMUNITY)
Admission: RE | Disposition: A | Discharge status: Home / Self Care | Payer: Self-pay | Source: Home / Self Care | Attending: Neurological Surgery

## 2019-06-20 DIAGNOSIS — D320 Benign neoplasm of cerebral meninges: Secondary | ICD-10-CM | POA: Diagnosis present

## 2019-06-20 DIAGNOSIS — Z833 Family history of diabetes mellitus: Secondary | ICD-10-CM

## 2019-06-20 DIAGNOSIS — Z9889 Other specified postprocedural states: Secondary | ICD-10-CM

## 2019-06-20 DIAGNOSIS — Z20822 Contact with and (suspected) exposure to covid-19: Secondary | ICD-10-CM | POA: Diagnosis present

## 2019-06-20 DIAGNOSIS — Z8261 Family history of arthritis: Secondary | ICD-10-CM | POA: Diagnosis not present

## 2019-06-20 DIAGNOSIS — Z825 Family history of asthma and other chronic lower respiratory diseases: Secondary | ICD-10-CM

## 2019-06-20 DIAGNOSIS — Z823 Family history of stroke: Secondary | ICD-10-CM | POA: Diagnosis not present

## 2019-06-20 DIAGNOSIS — Z8249 Family history of ischemic heart disease and other diseases of the circulatory system: Secondary | ICD-10-CM | POA: Diagnosis not present

## 2019-06-20 DIAGNOSIS — Z6841 Body Mass Index (BMI) 40.0 and over, adult: Secondary | ICD-10-CM | POA: Diagnosis not present

## 2019-06-20 DIAGNOSIS — D496 Neoplasm of unspecified behavior of brain: Secondary | ICD-10-CM | POA: Diagnosis present

## 2019-06-20 HISTORY — PX: CRANIOTOMY: SHX93

## 2019-06-20 HISTORY — PX: APPLICATION OF CRANIAL NAVIGATION: SHX6578

## 2019-06-20 LAB — MRSA PCR SCREENING: MRSA by PCR: NEGATIVE

## 2019-06-20 LAB — POCT PREGNANCY, URINE: Preg Test, Ur: NEGATIVE

## 2019-06-20 LAB — BASIC METABOLIC PANEL
Anion gap: 10 (ref 5–15)
BUN: 10 mg/dL (ref 6–20)
CO2: 19 mmol/L — ABNORMAL LOW (ref 22–32)
Calcium: 8.2 mg/dL — ABNORMAL LOW (ref 8.9–10.3)
Chloride: 107 mmol/L (ref 98–111)
Creatinine, Ser: 0.79 mg/dL (ref 0.44–1.00)
GFR calc Af Amer: >60 mL/min (ref >60)
GFR calc non Af Amer: >60 mL/min (ref >60)
Glucose, Bld: 161 mg/dL — ABNORMAL HIGH (ref 70–99)
Potassium: 4 mmol/L (ref 3.5–5.1)
Sodium: 136 mmol/L (ref 135–145)

## 2019-06-20 LAB — GLUCOSE, CAPILLARY: Glucose-Capillary: 225 mg/dL — ABNORMAL HIGH (ref 70–99)

## 2019-06-20 SURGERY — CRANIOTOMY TUMOR EXCISION
Anesthesia: General | Site: Head

## 2019-06-20 MED ORDER — SUCCINYLCHOLINE CHLORIDE 20 MG/ML IJ SOLN
INTRAMUSCULAR | Status: DC | PRN
  Administered 2019-06-20: 140 mg via INTRAVENOUS

## 2019-06-20 MED ORDER — POLYETHYLENE GLYCOL 3350 17 G PO PACK: 17.0000 g | PACK | Freq: Every day | ORAL | Status: DC | PRN

## 2019-06-20 MED ORDER — HYDROMORPHONE HCL 1 MG/ML IJ SOLN
0.5000 mg | INTRAMUSCULAR | Status: DC | PRN
  Administered 2019-06-20 – 2019-06-23 (×16): 0.5 mg via INTRAVENOUS
  Filled 2019-06-20 (×16): qty 1

## 2019-06-20 MED ORDER — HYDROXYZINE HCL 10 MG PO TABS
10.0000 mg | ORAL_TABLET | Freq: Every evening | ORAL | Status: DC | PRN
  Filled 2019-06-20: qty 1

## 2019-06-20 MED ORDER — CHLORHEXIDINE GLUCONATE 0.12 % MT SOLN
OROMUCOSAL | Status: AC
  Administered 2019-06-20: 15 mL via OROMUCOSAL
  Filled 2019-06-20: qty 15

## 2019-06-20 MED ORDER — CELECOXIB 200 MG PO CAPS
200.0000 mg | ORAL_CAPSULE | Freq: Once | ORAL | Status: AC
  Administered 2019-06-20: 200 mg via ORAL
  Filled 2019-06-20: qty 1

## 2019-06-20 MED ORDER — AMISULPRIDE (ANTIEMETIC) 5 MG/2ML IV SOLN
INTRAVENOUS | Status: AC
  Administered 2019-06-20: 10 mg via INTRAVENOUS
  Filled 2019-06-20: qty 2

## 2019-06-20 MED ORDER — VANCOMYCIN HCL IN DEXTROSE 1-5 GM/200ML-% IV SOLN
INTRAVENOUS | Status: AC
  Administered 2019-06-20: 1000 mg via INTRAVENOUS
  Filled 2019-06-20: qty 200

## 2019-06-20 MED ORDER — ONDANSETRON HCL 4 MG/2ML IJ SOLN
4.0000 mg | INTRAMUSCULAR | Status: DC | PRN
  Administered 2019-06-20: 4 mg via INTRAVENOUS
  Filled 2019-06-20: qty 2

## 2019-06-20 MED ORDER — HEMOSTATIC AGENTS (NO CHARGE) OPTIME
TOPICAL | Status: DC | PRN
  Administered 2019-06-20: 1 via TOPICAL

## 2019-06-20 MED ORDER — PROMETHAZINE HCL 25 MG/ML IJ SOLN
6.2500 mg | INTRAMUSCULAR | Status: AC | PRN
  Administered 2019-06-20 (×2): 6.25 mg via INTRAVENOUS

## 2019-06-20 MED ORDER — SODIUM CHLORIDE 0.9 % IV SOLN: INTRAVENOUS | Status: DC | PRN

## 2019-06-20 MED ORDER — CHLORHEXIDINE GLUCONATE CLOTH 2 % EX PADS: 6.0000 | MEDICATED_PAD | Freq: Once | CUTANEOUS | Status: DC

## 2019-06-20 MED ORDER — DOCUSATE SODIUM 100 MG PO CAPS
100.0000 mg | ORAL_CAPSULE | Freq: Two times a day (BID) | ORAL | Status: DC
  Administered 2019-06-20 – 2019-06-24 (×8): 100 mg via ORAL
  Filled 2019-06-20 (×8): qty 1

## 2019-06-20 MED ORDER — BACITRACIN ZINC 500 UNIT/GM EX OINT
TOPICAL_OINTMENT | CUTANEOUS | Status: DC | PRN
  Administered 2019-06-20: 1 via TOPICAL

## 2019-06-20 MED ORDER — HYDROCODONE-ACETAMINOPHEN 5-325 MG PO TABS
1.0000 | ORAL_TABLET | ORAL | Status: DC | PRN
  Administered 2019-06-20 – 2019-06-22 (×9): 1 via ORAL
  Filled 2019-06-20 (×9): qty 1

## 2019-06-20 MED ORDER — UMECLIDINIUM BROMIDE 62.5 MCG/INH IN AEPB
1.0000 | INHALATION_SPRAY | Freq: Every day | RESPIRATORY_TRACT | Status: DC
  Administered 2019-06-21 – 2019-06-24 (×4): 1 via RESPIRATORY_TRACT
  Filled 2019-06-20: qty 7

## 2019-06-20 MED ORDER — ROCURONIUM BROMIDE 10 MG/ML (PF) SYRINGE
PREFILLED_SYRINGE | INTRAVENOUS | Status: DC | PRN
  Administered 2019-06-20: 40 mg via INTRAVENOUS
  Administered 2019-06-20 (×2): 30 mg via INTRAVENOUS
  Administered 2019-06-20: 20 mg via INTRAVENOUS

## 2019-06-20 MED ORDER — OLOPATADINE HCL 0.1 % OP SOLN
1.0000 [drp] | Freq: Two times a day (BID) | OPHTHALMIC | Status: DC | PRN
  Filled 2019-06-20: qty 5

## 2019-06-20 MED ORDER — FENTANYL CITRATE (PF) 100 MCG/2ML IJ SOLN
INTRAMUSCULAR | Status: DC | PRN
  Administered 2019-06-20 (×3): 50 ug via INTRAVENOUS
  Administered 2019-06-20: 100 ug via INTRAVENOUS
  Administered 2019-06-20: 50 ug via INTRAVENOUS
  Administered 2019-06-20: 25 ug via INTRAVENOUS

## 2019-06-20 MED ORDER — GLYCOPYRROLATE 1 MG PO TABS: 1.0000 mg | ORAL_TABLET | Freq: Two times a day (BID) | ORAL | Status: DC

## 2019-06-20 MED ORDER — ALBUMIN HUMAN 5 % IV SOLN
INTRAVENOUS | Status: DC | PRN
Start: 2019-06-20 — End: 2019-06-20

## 2019-06-20 MED ORDER — PROMETHAZINE HCL 12.5 MG PO TABS
12.5000 mg | ORAL_TABLET | ORAL | Status: DC | PRN
  Filled 2019-06-20: qty 2

## 2019-06-20 MED ORDER — TIOTROPIUM BROMIDE MONOHYDRATE 1.25 MCG/ACT IN AERS: 1.0000 | INHALATION_SPRAY | Freq: Two times a day (BID) | RESPIRATORY_TRACT | Status: DC

## 2019-06-20 MED ORDER — THROMBIN 5000 UNITS EX SOLR: OROMUCOSAL | Status: DC | PRN

## 2019-06-20 MED ORDER — THROMBIN 5000 UNITS EX SOLR
CUTANEOUS | Status: AC
  Filled 2019-06-20: qty 10000

## 2019-06-20 MED ORDER — FENTANYL CITRATE (PF) 250 MCG/5ML IJ SOLN
INTRAMUSCULAR | Status: AC
  Filled 2019-06-20: qty 5

## 2019-06-20 MED ORDER — SUGAMMADEX SODIUM 200 MG/2ML IV SOLN
INTRAVENOUS | Status: DC | PRN
  Administered 2019-06-20: 260 mg via INTRAVENOUS

## 2019-06-20 MED ORDER — PROPOFOL 10 MG/ML IV BOLUS
INTRAVENOUS | Status: DC | PRN
  Administered 2019-06-20: 50 mg via INTRAVENOUS
  Administered 2019-06-20: 150 mg via INTRAVENOUS

## 2019-06-20 MED ORDER — HEPARIN SODIUM (PORCINE) 5000 UNIT/ML IJ SOLN
5000.0000 [IU] | Freq: Three times a day (TID) | INTRAMUSCULAR | Status: DC
  Administered 2019-06-22 – 2019-06-24 (×7): 5000 [IU] via SUBCUTANEOUS
  Filled 2019-06-20 (×8): qty 1

## 2019-06-20 MED ORDER — DIPHENHYDRAMINE HCL 50 MG/ML IJ SOLN
INTRAMUSCULAR | Status: DC | PRN
  Administered 2019-06-20: 12.5 mg via INTRAVENOUS

## 2019-06-20 MED ORDER — ONDANSETRON HCL 4 MG/2ML IJ SOLN
INTRAMUSCULAR | Status: DC | PRN
  Administered 2019-06-20: 4 mg via INTRAVENOUS

## 2019-06-20 MED ORDER — ACETAMINOPHEN 325 MG PO TABS
650.0000 mg | ORAL_TABLET | ORAL | Status: DC | PRN
  Administered 2019-06-22: 650 mg via ORAL
  Filled 2019-06-20: qty 2

## 2019-06-20 MED ORDER — DEXAMETHASONE SODIUM PHOSPHATE 10 MG/ML IJ SOLN
INTRAMUSCULAR | Status: DC | PRN
  Administered 2019-06-20: 10 mg via INTRAVENOUS

## 2019-06-20 MED ORDER — 0.9 % SODIUM CHLORIDE (POUR BTL) OPTIME
TOPICAL | Status: DC | PRN
  Administered 2019-06-20 (×3): 1000 mL

## 2019-06-20 MED ORDER — SUGAMMADEX SODIUM 500 MG/5ML IV SOLN
INTRAVENOUS | Status: AC
  Filled 2019-06-20: qty 5

## 2019-06-20 MED ORDER — ROCURONIUM BROMIDE 10 MG/ML (PF) SYRINGE
PREFILLED_SYRINGE | INTRAVENOUS | Status: AC
  Filled 2019-06-20: qty 10

## 2019-06-20 MED ORDER — BACITRACIN ZINC 500 UNIT/GM EX OINT
TOPICAL_OINTMENT | CUTANEOUS | Status: AC
  Filled 2019-06-20: qty 28.35

## 2019-06-20 MED ORDER — ONDANSETRON HCL 4 MG/2ML IJ SOLN
INTRAMUSCULAR | Status: AC
  Filled 2019-06-20: qty 2

## 2019-06-20 MED ORDER — ORAL CARE MOUTH RINSE: 15.0000 mL | Freq: Once | OROMUCOSAL | Status: AC

## 2019-06-20 MED ORDER — THROMBIN 20000 UNITS EX SOLR: CUTANEOUS | Status: DC | PRN

## 2019-06-20 MED ORDER — AMISULPRIDE (ANTIEMETIC) 5 MG/2ML IV SOLN
INTRAVENOUS | Status: AC
  Filled 2019-06-20: qty 2

## 2019-06-20 MED ORDER — LIDOCAINE-EPINEPHRINE 1 %-1:100000 IJ SOLN
INTRAMUSCULAR | Status: DC | PRN
  Administered 2019-06-20: 10 mL

## 2019-06-20 MED ORDER — LACTATED RINGERS IV SOLN: INTRAVENOUS | Status: DC

## 2019-06-20 MED ORDER — ONDANSETRON HCL 4 MG PO TABS: 4.0000 mg | ORAL_TABLET | ORAL | Status: DC | PRN

## 2019-06-20 MED ORDER — LIDOCAINE 2% (20 MG/ML) 5 ML SYRINGE
INTRAMUSCULAR | Status: AC
  Filled 2019-06-20: qty 5

## 2019-06-20 MED ORDER — ACETAMINOPHEN 500 MG PO TABS
1000.0000 mg | ORAL_TABLET | Freq: Once | ORAL | Status: AC
  Administered 2019-06-20: 1000 mg via ORAL
  Filled 2019-06-20: qty 2

## 2019-06-20 MED ORDER — SCOPOLAMINE 1 MG/3DAYS TD PT72
1.0000 | MEDICATED_PATCH | TRANSDERMAL | Status: DC
  Administered 2019-06-20: 1.5 mg via TRANSDERMAL
  Filled 2019-06-20: qty 1

## 2019-06-20 MED ORDER — VANCOMYCIN HCL 1250 MG/250ML IV SOLN
1250.0000 mg | Freq: Three times a day (TID) | INTRAVENOUS | Status: DC
  Filled 2019-06-20: qty 250

## 2019-06-20 MED ORDER — AMISULPRIDE (ANTIEMETIC) 5 MG/2ML IV SOLN: 10.0000 mg | Freq: Once | INTRAVENOUS | Status: AC

## 2019-06-20 MED ORDER — PANTOPRAZOLE SODIUM 40 MG PO TBEC
40.0000 mg | DELAYED_RELEASE_TABLET | Freq: Every day | ORAL | Status: DC
  Administered 2019-06-21 – 2019-06-24 (×4): 40 mg via ORAL
  Filled 2019-06-20 (×4): qty 1

## 2019-06-20 MED ORDER — ALBUTEROL SULFATE HFA 108 (90 BASE) MCG/ACT IN AERS: 2.0000 | INHALATION_SPRAY | Freq: Four times a day (QID) | RESPIRATORY_TRACT | Status: DC | PRN

## 2019-06-20 MED ORDER — CHLORHEXIDINE GLUCONATE CLOTH 2 % EX PADS
6.0000 | MEDICATED_PAD | Freq: Every day | CUTANEOUS | Status: DC
  Administered 2019-06-21 – 2019-06-23 (×2): 6 via TOPICAL

## 2019-06-20 MED ORDER — FENTANYL CITRATE (PF) 100 MCG/2ML IJ SOLN
INTRAMUSCULAR | Status: AC
  Administered 2019-06-20: 50 ug via INTRAVENOUS
  Filled 2019-06-20: qty 2

## 2019-06-20 MED ORDER — VANCOMYCIN HCL IN DEXTROSE 1-5 GM/200ML-% IV SOLN
1000.0000 mg | INTRAVENOUS | Status: AC
  Filled 2019-06-20: qty 200

## 2019-06-20 MED ORDER — RIZATRIPTAN BENZOATE 10 MG PO TBDP: 10.0000 mg | ORAL_TABLET | ORAL | Status: DC | PRN

## 2019-06-20 MED ORDER — FENTANYL CITRATE (PF) 100 MCG/2ML IJ SOLN
25.0000 ug | INTRAMUSCULAR | Status: DC | PRN
  Administered 2019-06-20: 50 ug via INTRAVENOUS

## 2019-06-20 MED ORDER — VANCOMYCIN HCL IN DEXTROSE 1-5 GM/200ML-% IV SOLN
1000.0000 mg | Freq: Two times a day (BID) | INTRAVENOUS | Status: AC
  Administered 2019-06-20 – 2019-06-21 (×3): 1000 mg via INTRAVENOUS
  Filled 2019-06-20 (×3): qty 200

## 2019-06-20 MED ORDER — LACTATED RINGERS IV SOLN: INTRAVENOUS | Status: DC | PRN

## 2019-06-20 MED ORDER — CHLORHEXIDINE GLUCONATE 0.12 % MT SOLN: 15.0000 mL | Freq: Once | OROMUCOSAL | Status: AC

## 2019-06-20 MED ORDER — ACETAMINOPHEN 650 MG RE SUPP: 650.0000 mg | RECTAL | Status: DC | PRN

## 2019-06-20 MED ORDER — LIDOCAINE 2% (20 MG/ML) 5 ML SYRINGE
INTRAMUSCULAR | Status: DC | PRN
  Administered 2019-06-20: 100 mg via INTRAVENOUS

## 2019-06-20 MED ORDER — LIDOCAINE-EPINEPHRINE 1 %-1:100000 IJ SOLN
INTRAMUSCULAR | Status: AC
  Filled 2019-06-20: qty 1

## 2019-06-20 MED ORDER — DEXAMETHASONE SODIUM PHOSPHATE 10 MG/ML IJ SOLN
INTRAMUSCULAR | Status: AC
  Filled 2019-06-20: qty 1

## 2019-06-20 MED ORDER — THROMBIN 20000 UNITS EX SOLR
CUTANEOUS | Status: AC
  Filled 2019-06-20: qty 20000

## 2019-06-20 MED ORDER — PROPOFOL 10 MG/ML IV BOLUS
INTRAVENOUS | Status: AC
  Filled 2019-06-20: qty 20

## 2019-06-20 MED ORDER — LORATADINE 10 MG PO TABS
10.0000 mg | ORAL_TABLET | Freq: Every day | ORAL | Status: DC
  Administered 2019-06-21 – 2019-06-24 (×4): 10 mg via ORAL
  Filled 2019-06-20 (×4): qty 1

## 2019-06-20 MED ORDER — LABETALOL HCL 5 MG/ML IV SOLN: 10.0000 mg | INTRAVENOUS | Status: DC | PRN

## 2019-06-20 MED ORDER — SUCCINYLCHOLINE CHLORIDE 200 MG/10ML IV SOSY
PREFILLED_SYRINGE | INTRAVENOUS | Status: AC
  Filled 2019-06-20: qty 10

## 2019-06-20 MED ORDER — SERTRALINE HCL 50 MG PO TABS
50.0000 mg | ORAL_TABLET | Freq: Every day | ORAL | Status: DC
  Administered 2019-06-21 – 2019-06-24 (×4): 50 mg via ORAL
  Filled 2019-06-20 (×4): qty 1

## 2019-06-20 MED ORDER — PROMETHAZINE HCL 25 MG/ML IJ SOLN
INTRAMUSCULAR | Status: AC
  Filled 2019-06-20: qty 1

## 2019-06-20 MED ORDER — DIPHENHYDRAMINE HCL 50 MG/ML IJ SOLN
INTRAMUSCULAR | Status: AC
  Filled 2019-06-20: qty 1

## 2019-06-20 SURGICAL SUPPLY — 63 items
BAND INSRT 18 STRL LF DISP RB (MISCELLANEOUS) ×4
BAND RUBBER #18 3X1/16 STRL (MISCELLANEOUS) ×2 IMPLANT
BLADE CLIPPER SURG (BLADE) ×3 IMPLANT
BNDG CMPR 75X41 PLY HI ABS (GAUZE/BANDAGES/DRESSINGS)
BNDG GAUZE ELAST 4 BULKY (GAUZE/BANDAGES/DRESSINGS) IMPLANT
BNDG STRETCH 4X75 STRL LF (GAUZE/BANDAGES/DRESSINGS) IMPLANT
BUR ACORN 9.0 PRECISION (BURR) ×3 IMPLANT
BUR SABER DIAMOND 5.0 (BURR) ×1 IMPLANT
BUR SPIRAL ROUTER 2.3 (BUR) ×3 IMPLANT
BUR TAPERED ROUTER 3.0 (BURR) ×1 IMPLANT
CANISTER SUCT 3000ML PPV (MISCELLANEOUS) ×6 IMPLANT
CNTNR URN SCR LID CUP LEK RST (MISCELLANEOUS) ×2 IMPLANT
CONT SPEC 4OZ STRL OR WHT (MISCELLANEOUS) ×3
COVER BURR HOLE UNIV 10 (Orthopedic Implant) ×2 IMPLANT
DRAPE HALF SHEET 40X57 (DRAPES) ×3 IMPLANT
DRAPE MICROSCOPE LEICA (MISCELLANEOUS) ×1 IMPLANT
DRAPE NEUROLOGICAL W/INCISE (DRAPES) ×3 IMPLANT
DRAPE WARM FLUID 44X44 (DRAPES) ×3 IMPLANT
DURAPREP 6ML APPLICATOR 50/CS (WOUND CARE) ×3 IMPLANT
ELECT REM PT RETURN 9FT ADLT (ELECTROSURGICAL) ×3
ELECTRODE REM PT RTRN 9FT ADLT (ELECTROSURGICAL) ×2 IMPLANT
FORCEPS BIPOLAR SPETZLER 8 1.0 (NEUROSURGERY SUPPLIES) ×3 IMPLANT
GLOVE BIO SURGEON STRL SZ7 (GLOVE) ×2 IMPLANT
GLOVE BIO SURGEON STRL SZ7.5 (GLOVE) ×3 IMPLANT
GLOVE BIOGEL PI IND STRL 7.0 (GLOVE) IMPLANT
GLOVE BIOGEL PI IND STRL 7.5 (GLOVE) ×2 IMPLANT
GLOVE BIOGEL PI INDICATOR 7.0 (GLOVE) ×2
GLOVE BIOGEL PI INDICATOR 7.5 (GLOVE) ×4
GOWN STRL REUS W/ TWL LRG LVL3 (GOWN DISPOSABLE) ×4 IMPLANT
GOWN STRL REUS W/ TWL XL LVL3 (GOWN DISPOSABLE) IMPLANT
GOWN STRL REUS W/TWL 2XL LVL3 (GOWN DISPOSABLE) IMPLANT
GOWN STRL REUS W/TWL LRG LVL3 (GOWN DISPOSABLE) ×6
GOWN STRL REUS W/TWL XL LVL3 (GOWN DISPOSABLE) ×3
GRAFT DURAGEN MATRIX 3WX3L (Graft) ×3 IMPLANT
GRAFT DURAGEN MATRIX 3X3 SNGL (Graft) IMPLANT
HEMOSTAT POWDER KIT SURGIFOAM (HEMOSTASIS) ×3 IMPLANT
HEMOSTAT SURGICEL 2X14 (HEMOSTASIS) ×3 IMPLANT
HOOK DURA 1/2IN (MISCELLANEOUS) ×3 IMPLANT
IV NS 1000ML (IV SOLUTION) ×6
IV NS 1000ML BAXH (IV SOLUTION) ×2 IMPLANT
KIT BASIN OR (CUSTOM PROCEDURE TRAY) ×3 IMPLANT
KIT DRAIN CSF ACCUDRAIN (MISCELLANEOUS) IMPLANT
KIT TURNOVER KIT B (KITS) ×3 IMPLANT
MARKER SPHERE PSV REFLC 13MM (MARKER) ×9 IMPLANT
NEEDLE HYPO 22GX1.5 SAFETY (NEEDLE) ×3 IMPLANT
NS IRRIG 1000ML POUR BTL (IV SOLUTION) ×9 IMPLANT
PACK CRANIOTOMY CUSTOM (CUSTOM PROCEDURE TRAY) ×3 IMPLANT
PIN MAYFIELD SKULL DISP (PIN) ×3 IMPLANT
PLATE BONE 12 2H TARGET XL (Plate) ×1 IMPLANT
PLATE CMF SML 4H (Plate) ×1 IMPLANT
SCREW UNIII AXS SD 1.5X4 (Screw) ×12 IMPLANT
SPONGE SURGIFOAM ABS GEL 100 (HEMOSTASIS) ×3 IMPLANT
STAPLER VISISTAT 35W (STAPLE) ×3 IMPLANT
SUT NURALON 4 0 TR CR/8 (SUTURE) ×8 IMPLANT
SUT VIC AB 2-0 CP2 18 (SUTURE) ×4 IMPLANT
TIP STANDARD 36KHZ (INSTRUMENTS) ×3
TIP STD 36KHZ (INSTRUMENTS) IMPLANT
TOWEL GREEN STERILE (TOWEL DISPOSABLE) ×3 IMPLANT
TOWEL GREEN STERILE FF (TOWEL DISPOSABLE) ×3 IMPLANT
TRAY FOLEY MTR SLVR 16FR STAT (SET/KITS/TRAYS/PACK) ×3 IMPLANT
TUBE CONNECTING 12X1/4 (SUCTIONS) ×3 IMPLANT
WATER STERILE IRR 1000ML POUR (IV SOLUTION) ×3 IMPLANT
WRENCH TORQUE 36KHZ (INSTRUMENTS) ×1 IMPLANT

## 2019-06-20 NOTE — Anesthesia Procedure Notes (Signed)
Arterial Line Insertion Start/End5/24/2021 7:45 AM, 06/20/2019 7:49 AM Performed by: Alain Marion, CRNA, CRNA  Patient location: OR. Patient sedated Right, radial was placed Catheter size: 20 G Hand hygiene performed  and maximum sterile barriers used  Allen's test indicative of satisfactory collateral circulation Attempts: 1 Procedure performed without using ultrasound guided technique. Following insertion, dressing applied and Biopatch. Post procedure assessment: normal  Patient tolerated the procedure well with no immediate complications.

## 2019-06-20 NOTE — Progress Notes (Signed)
Neurosurgery Service Post-operative progress note  Assessment & Plan: 34 y.o. woman s/p tumor resection, seen in PACU, awake/alert, FCx4 with full strength, speech fluent.  -admit to 4N -MRI w/wo tomorrow -hold SQH until POD2 -activity and diet as tolerated  Judith Part  06/20/19 11:55 AM

## 2019-06-20 NOTE — Transfer of Care (Signed)
Immediate Anesthesia Transfer of Care Note  Patient: Kendra Schneider  Procedure(s) Performed: LEFT CRANIOTOMY FOR BRAIN TUMOR EXCISION (Left Head) APPLICATION OF CRANIAL NAVIGATION (N/A )  Patient Location: PACU  Anesthesia Type:General  Level of Consciousness: awake  Airway & Oxygen Therapy: Patient Spontanous Breathing  Post-op Assessment: Report given to RN and Patient moving all extremities X 4  Post vital signs: Reviewed and stable  Last Vitals:  Vitals Value Taken Time  BP 114/63 06/20/19 1146  Temp    Pulse 98 06/20/19 1149  Resp 22 06/20/19 1149  SpO2 91 % 06/20/19 1149  Vitals shown include unvalidated device data.  Last Pain:  Vitals:   06/20/19 0700  TempSrc:   PainSc: 0-No pain      Patients Stated Pain Goal: 3 ([phone] [phone])  Complications: No apparent anesthesia complications

## 2019-06-20 NOTE — Brief Op Note (Signed)
06/20/2019  11:54 AM  PATIENT:  Kendra Schneider  34 y.o. female  PRE-OPERATIVE DIAGNOSIS:  Brain tumor  POST-OPERATIVE DIAGNOSIS:  Brain tumor  PROCEDURE:  Procedure(s): LEFT CRANIOTOMY FOR BRAIN TUMOR EXCISION (Left) APPLICATION OF CRANIAL NAVIGATION (N/A)  SURGEON:  Surgeon(s) and Role:    * Henson Fraticelli, Joyice Faster, MD - Primary  PHYSICIAN ASSISTANT:   ASSISTANTS: none   ANESTHESIA:   general  EBL:  700 mL   BLOOD ADMINISTERED:none  DRAINS: none   LOCAL MEDICATIONS USED:  LIDOCAINE   SPECIMEN:  Source of Specimen:  Left frontal brain tumor  DISPOSITION OF SPECIMEN:  PATHOLOGY  COUNTS:  YES  TOURNIQUET:  * No tourniquets in log *  DICTATION: .Note written in EPIC  PLAN OF CARE: Admit to inpatient   PATIENT DISPOSITION:  PACU - hemodynamically stable.   Delay start of Pharmacological VTE agent (>24hrs) due to surgical blood loss or risk of bleeding: yes

## 2019-06-20 NOTE — Anesthesia Procedure Notes (Signed)
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
## 2019-06-20 NOTE — Anesthesia Postprocedure Evaluation (Signed)
Anesthesia Post Note  Patient: Kendra Schneider  Procedure(s) Performed: LEFT CRANIOTOMY FOR BRAIN TUMOR EXCISION (Left Head) APPLICATION OF CRANIAL NAVIGATION (N/A )     Patient location during evaluation: PACU Anesthesia Type: General Level of consciousness: sedated Pain management: pain level controlled Vital Signs Assessment: post-procedure vital signs reviewed and stable Respiratory status: spontaneous breathing and respiratory function stable Cardiovascular status: stable Postop Assessment: no apparent nausea or vomiting Anesthetic complications: no    Last Vitals:  Vitals:   06/20/19 1230 06/20/19 1245  BP: 109/63 126/80  Pulse: 87 87  Resp: (!) 22 18  Temp: 36.8 C   SpO2: 95% 96%    Last Pain:  Vitals:   06/20/19 1240  TempSrc:   PainSc: 8                  Babak Lucus DANIEL

## 2019-06-20 NOTE — Progress Notes (Signed)
Pharmacy Antibiotic Note  Kendra Schneider is a 34 y.o. female admitted on 06/20/2019 with L frontal meningioma, now s/p craniotomy and resection 5/24.  Pharmacy has been consulted for vancomycin dosing for surgical prophylaxis. SCr 0.79 on admit (appears to be at baseline).  Patient received pre-op dose of vancomycin 1g IV x 1 at 0724 this AM.  Plan: Continue vancomycin 1g IV q12h Monitor clinical progress, c/s, renal function F/u LOT, vancomycin levels at steady state   Height: 4\' 11"  (149.9 cm) Weight: 127.3 kg (280 lb 10.3 oz) IBW/kg (Calculated) : 43.2  Temp (24hrs), Avg:98.7 F (37.1 C), Min:98.3 F (36.8 C), Max:99.2 F (37.3 C)  Recent Labs  Lab 06/17/19 1415  WBC 12.0*    CrCl cannot be calculated (Patient's most recent lab result is older than the maximum 21 days allowed.).    Allergies  Allergen Reactions  . Tylenol With Codeine #3 [Acetaminophen-Codeine] Rash  . Bee Venom Hives  . Chocolate Hives  . Amoxicillin Hives and Rash  . Other Hives and Rash    Panmist, mushrooms, peanuts, honey nut cheerios, strawberries, nuts, grapes, tomatoes, tomato sauce, fish.     Arturo Morton, PharmD, BCPS Please check AMION for all [facility] contact numbers Clinical Pharmacist 06/20/2019 1:45 PM

## 2019-06-20 NOTE — Op Note (Signed)
PATIENT: Kendra Schneider  DAY OF SURGERY: 06/20/19   PRE-OPERATIVE DIAGNOSIS:  Left frontal meningioma   POST-OPERATIVE DIAGNOSIS:  Left frontal meningioma   PROCEDURE:  Left pterional craniotomy for resection of meningioma   SURGEON:  Surgeon(s) and Role:    Judith Part, MD - Primary   ANESTHESIA: ETGA   BRIEF HISTORY: This is a 34 year old woman who presented with headaches and was found to have a left frontal meningioma. She also had a likely small left temporal meningioma and a third in the medial aspect of the clinoid. This was discussed with the patient, who has developmental delay, and therefore with her family as well. I recommended surgical resection of the largest of the three and, if easily surgical accessible, resection of the other two. We discussed risks, benefits, and alternatives and they wished to proceed with surgery.   OPERATIVE DETAIL: The patient was taken to the operating room and placed on the OR table in the supine position. A formal time out was performed with two patient identifiers and confirmed the operative site. Anesthesia was induced by the anesthesia team. The [place] head holder was applied to the head and a registration array was attached to the [place]. This was co-registered with the patient's preoperative imaging, the fit appeared to be acceptable. Using frameless stereotaxy, the operative trajectory was planned and the incision was marked. Hair was clipped with surgical clippers over the incision and the area was then prepped and draped in a sterile fashion.  A standard pterional incision was placed on the left side. Soft tissues were dissected and a left pterional craniotomy was performed. The large frontal tumor was immediately evident. Samples were sent for pathology, the dural base of the tumor was devascularized, the tumor was debulked, then dissected off cortex and resected. The tumor was rather viscous and therefore was mainly aspirated and not  removed en bloc. The bone was, as expected hyper-ostotic and highly vascular. A 21mm diamond burr was therefore used to resect more infiltrated bone. The dura was hypervascular and resected to margins according to frameless stereotaxy, remaining edges were cauterized.  The smaller temporal tumor or sphenoid tumors were not visible in the surgical field and therefore not resected.   Hemostasis was confirmed, the dura was reapproximated with suture, and titanium plates and screws were used to reattach the bone flap.  All instrument and sponge counts were correct, the incision was then closed in layers. The patient was then returned to anesthesia for emergence. No apparent complications at the completion of the procedure.   EBL:  6110mL   DRAINS: none   SPECIMENS: Left frontal brain tumor   Judith Part, MD 06/20/19 7:22 AM

## 2019-06-21 ENCOUNTER — Encounter: Payer: Self-pay | Admitting: *Deleted

## 2019-06-21 ENCOUNTER — Ambulatory Visit: Payer: Medicaid Other | Admitting: Family Medicine

## 2019-06-21 ENCOUNTER — Inpatient Hospital Stay (HOSPITAL_COMMUNITY): Payer: Medicaid Other

## 2019-06-21 LAB — CBC
HCT: 34.8 % — ABNORMAL LOW (ref 36.0–46.0)
Hemoglobin: 10.9 g/dL — ABNORMAL LOW (ref 12.0–15.0)
MCH: 27 pg (ref 26.0–34.0)
MCHC: 31.3 g/dL (ref 30.0–36.0)
MCV: 86.1 fL (ref 80.0–100.0)
Platelets: 240 10*3/uL (ref 150–400)
RBC: 4.04 MIL/uL (ref 3.87–5.11)
RDW: 14.4 % (ref 11.5–15.5)
WBC: 13.8 10*3/uL — ABNORMAL HIGH (ref 4.0–10.5)
nRBC: 0 % (ref 0.0–0.2)

## 2019-06-21 LAB — SURGICAL PATHOLOGY

## 2019-06-21 MED ORDER — GADOBUTROL 1 MMOL/ML IV SOLN
10.0000 mL | Freq: Once | INTRAVENOUS | Status: AC | PRN
  Administered 2019-06-21: 10 mL via INTRAVENOUS

## 2019-06-21 NOTE — Evaluation (Signed)
Occupational Therapy Evaluation Patient Details Name: Kendra Schneider MRN: [id number] DOB: 01-Oct-1985 Today's Date: 06/21/2019    History of Present Illness Pt is a 34 y/o female found to have L frontal meningioma, now s/p craniotomy/resection on 5/24. PMHx includes anxiety, asthma, depression, UTI   Clinical Impression   This 34 y/o female presents with the above. PTA Pt reports independence with ADL and functional mobility, living with her aunt/uncle. Pt exiting bathroom upon arrival to room with NT assist. Overall pt performing room level mobility tasks without AD at minguard assist level. She currently requires minguard assist for LB ADL, setup assist for seated UB ADL. Pt does endorse dizziness with mobility/activity today which she reports subsides with seated rest, BP 130/91 end of session seated in recliner. She will also benefit from further visual assessment within functional/ADL context as pt with notable difficulty tracking/maintaining eye alignment away from midline during formal assessment. Anticipate pt to make steady progress and be able to progress home with family assist. Will continue to follow while acutely admitted.     Follow Up Recommendations  No OT follow up;Supervision/Assistance - 24 hour(24hr initially)    Equipment Recommendations  None recommended by OT           Precautions / Restrictions Precautions Precautions: Fall Restrictions Weight Bearing Restrictions: No      Mobility Bed Mobility               General bed mobility comments: pt OOB with NT upon arrival, ended session in recliner   Transfers Overall transfer level: Needs assistance Equipment used: None Transfers: Sit to/from Stand Sit to Stand: Min guard         General transfer comment: for lines, balance and safety    Balance Overall balance assessment: Needs assistance Sitting-balance support: Feet supported Sitting balance-Leahy Scale: Good     Standing balance support: No  upper extremity supported;During functional activity Standing balance-Leahy Scale: Fair                             ADL either performed or assessed with clinical judgement   ADL Overall ADL's : Needs assistance/impaired Eating/Feeding: Modified independent;Sitting   Grooming: Set up;Min guard;Sitting   Upper Body Bathing: Set up;Supervision/ safety;Sitting   Lower Body Bathing: Min guard;Sit to/from stand Lower Body Bathing Details (indicate cue type and reason): close minguard for standing balance, discussed having pt use her aunt's shower seat for increased safety initially with bathing task Upper Body Dressing : Set up;Supervision/safety;Sitting   Lower Body Dressing: Sit to/from stand;Min guard   Toilet Transfer: Min guard;Ambulation Toilet Transfer Details (indicate cue type and reason): simulated via transfer to recliner, pt exiting bathroom upon arrival to room Toileting- Clothing Manipulation and Hygiene: Min guard;Sit to/from stand;Sitting/lateral lean       Functional mobility during ADLs: Min guard       Vision Baseline Vision/History: Wears glasses Wears Glasses: At all times Patient Visual Report: No change from baseline Vision Assessment?: Yes Eye Alignment: Within Functional Limits(slight ptosis of L eye ) Ocular Range of Motion: Impaired-to be further tested in functional context Tracking/Visual Pursuits: Decreased smoothness of horizontal tracking;Decreased smoothness of vertical tracking;Impaired - to be further tested in functional context;Unable to hold eye position out of midline Additional Comments: pt with notable difficulty tracking today, will continue visual assessment within functional context      Perception     Praxis      Pertinent Vitals/Pain  Pain Assessment: Faces Faces Pain Scale: Hurts little more Pain Location: head/incisional Pain Descriptors / Indicators: Discomfort;Sore Pain Intervention(s): Limited activity within  patient's tolerance;Monitored during session;Repositioned     Hand Dominance Right   Extremity/Trunk Assessment Upper Extremity Assessment Upper Extremity Assessment: Overall WFL for tasks assessed   Lower Extremity Assessment Lower Extremity Assessment: Defer to PT evaluation       Communication Communication Communication: No difficulties   Cognition Arousal/Alertness: Awake/alert Behavior During Therapy: WFL for tasks assessed/performed Overall Cognitive Status: History of cognitive impairments - at baseline                                 General Comments: pt reports difficulty with reading/writing at baseline, overall WFL for basic tasks and questions asked today    General Comments  max HR noted 122 with activity, SpO2 >90% on RA, BP 130/91 end of session seated in recliner     Exercises     Shoulder Instructions      Home Living Family/patient expects to be discharged to:: Private residence Living Arrangements: Other relatives(aunt/uncle) Available Help at Discharge: Family;Available 24 hours/day Type of Home: Mobile home Home Access: Stairs to enter [employer] of Steps: more than 8 pt unsure of how many specifically Entrance Stairs-Rails: Right;Left;Can reach both Home Layout: One level     Bathroom Shower/Tub: Teacher, early years/pre: Handicapped height     Home Equipment: Shower seat;Grab bars - tub/shower(reports shower seat is her aunts)          Prior Functioning/Environment Level of Independence: Independent                 OT Problem List: Decreased activity tolerance;Impaired balance (sitting and/or standing);Impaired vision/perception;Decreased knowledge of use of DME or AE;Obesity;Pain      OT Treatment/Interventions: Self-care/ADL training;Therapeutic exercise;Energy conservation;DME and/or AE instruction;Therapeutic activities;Cognitive remediation/compensation;Visual/perceptual  remediation/compensation;Patient/family education;Balance training    OT Goals(Current goals can be found in the care plan section) Acute Rehab OT Goals Patient Stated Goal: home when able  OT Goal Formulation: With patient Time For Goal Achievement: 07/05/19 Potential to Achieve Goals: Good  OT Frequency: Min 2X/week   Barriers to D/C:            Co-evaluation              AM-PAC OT "6 Clicks" Daily Activity     Outcome Measure Help from another person eating meals?: None Help from another person taking care of personal grooming?: A Little Help from another person toileting, which includes using toliet, bedpan, or urinal?: A Little Help from another person bathing (including washing, rinsing, drying)?: A Little Help from another person to put on and taking off regular upper body clothing?: A Little Help from another person to put on and taking off regular lower body clothing?: A Little 6 Click Score: 19   End of Session Nurse Communication: Mobility status  Activity Tolerance: Patient tolerated treatment well Patient left: in chair;with call bell/phone within reach;with chair alarm set  OT Visit Diagnosis: Other abnormalities of gait and mobility (R26.89);Other symptoms and signs involving the nervous system (R29.898);Dizziness and giddiness (R42)                Time: [id number] OT Time Calculation (min): 21 min Charges:  OT General Charges $OT Visit: 1 Visit OT Evaluation $OT Eval Moderate Complexity: [place], OT Acute Rehabilitation Services Pager [phone]  Office [phone]   Raymondo Band 06/21/2019, 3:30 PM

## 2019-06-21 NOTE — Progress Notes (Signed)
Neurosurgery Service Progress Note  Subjective: No acute events overnight, having headaches, otherwise no complaints. She feels like her headaches are actually better than preop.    Objective: Vitals:   06/21/19 0700 06/21/19 0800 06/21/19 0900 06/21/19 1000  BP: 125/89 (!) 138/95 120/87 117/75  Pulse: 85 86 (!) 101 (!) 108  Resp: 20 (!) 25 20 (!) 25  Temp:  98.4 F (36.9 C)    TempSrc:  Oral    SpO2: 96% 96% 95% 94%  Weight:      Height:       Temp (24hrs), Avg:98.5 F (36.9 C), Min:98.2 F (36.8 C), Max:98.7 F (37.1 C)  CBC Latest Ref Rng & Units 06/21/2019 06/17/2019 02/21/2019  WBC 4.0 - 10.5 K/uL 13.8(H) 12.0(H) 13.8(H)  Hemoglobin 12.0 - 15.0 g/dL 10.9(L) 14.1 14.8  Hematocrit 36.0 - 46.0 % 34.8(L) 45.5 45.8  Platelets 150 - 400 K/uL 240 261 270   BMP Latest Ref Rng & Units 06/20/2019 02/21/2019 09/24/2016  Glucose 70 - 99 mg/dL 161(H) 77 -  BUN 6 - 20 mg/dL 10 12 15   Creatinine 0.44 - 1.00 mg/dL 0.79 0.83 0.8  BUN/Creat Ratio 9 - 23 - 14 -  Sodium 135 - 145 mmol/L 136 143 142  Potassium 3.5 - 5.1 mmol/L 4.0 4.0 4.4  Chloride 98 - 111 mmol/L 107 104 106  CO2 22 - 32 mmol/L 19(L) 20 16  Calcium 8.9 - 10.3 mg/dL 8.2(L) 9.3 9.0    Intake/Output Summary (Last 24 hours) at 06/21/2019 1039 Last data filed at 06/21/2019 1000 Gross per 24 hour  Intake 2094.87 ml  Output 2725 ml  Net -630.13 ml    Current Facility-Administered Medications:  .  acetaminophen (TYLENOL) tablet 650 mg, 650 mg, Oral, Q4H PRN **OR** acetaminophen (TYLENOL) suppository 650 mg, 650 mg, Rectal, Q4H PRN, Judith Part, MD .  Chlorhexidine Gluconate Cloth 2 % PADS 6 each, 6 each, Topical, Daily, Judith Part, MD, 6 each at 06/21/19 1003 .  docusate sodium (COLACE) capsule 100 mg, 100 mg, Oral, BID, Hartwell Vandiver, Joyice Faster, MD, 100 mg at 06/21/19 1002 .  [START ON 06/22/2019] heparin injection 5,000 Units, 5,000 Units, Subcutaneous, Q8H, Samaj Wessells A, MD .  HYDROcodone-acetaminophen  (NORCO/VICODIN) 5-325 MG per tablet 1 tablet, 1 tablet, Oral, Q4H PRN, Judith Part, MD, 1 tablet at 06/21/19 1002 .  HYDROmorphone (DILAUDID) injection 0.5 mg, 0.5 mg, Intravenous, Q3H PRN, Judith Part, MD, 0.5 mg at 06/21/19 0809 .  hydrOXYzine (ATARAX/VISTARIL) tablet 10 mg, 10 mg, Oral, QHS PRN, Jory Welke A, MD .  labetalol (NORMODYNE) injection 10-40 mg, 10-40 mg, Intravenous, Q10 min PRN, Judith Part, MD .  lactated ringers infusion, , Intravenous, Continuous, Duane Boston, MD, Stopped at 06/21/19 [id number] .  loratadine (CLARITIN) tablet 10 mg, 10 mg, Oral, Daily, Jakhi Dishman A, MD, 10 mg at 06/21/19 1002 .  olopatadine (PATANOL) 0.1 % ophthalmic solution 1 drop, 1 drop, Both Eyes, BID PRN, Eliannah Hinde A, MD .  ondansetron (ZOFRAN) tablet 4 mg, 4 mg, Oral, Q4H PRN **OR** ondansetron (ZOFRAN) injection 4 mg, 4 mg, Intravenous, Q4H PRN, Judith Part, MD, 4 mg at 06/20/19 1325 .  pantoprazole (PROTONIX) EC tablet 40 mg, 40 mg, Oral, Daily, Pinkney Venard, Joyice Faster, MD, 40 mg at 06/21/19 1003 .  polyethylene glycol (MIRALAX / GLYCOLAX) packet 17 g, 17 g, Oral, Daily PRN, Juanette Urizar A, MD .  promethazine (PHENERGAN) tablet 12.5-25 mg, 12.5-25 mg, Oral, Q4H PRN, Judith Part, MD .  sertraline (ZOLOFT) tablet 50 mg, 50 mg, Oral, Daily, Lincy Belles, Joyice Faster, MD, 50 mg at 06/21/19 1003 .  umeclidinium bromide (INCRUSE ELLIPTA) 62.5 MCG/INH 1 puff, 1 puff, Inhalation, Daily, Judith Part, MD, 1 puff at 06/21/19 [phone] .  vancomycin (VANCOCIN) IVPB 1000 mg/200 mL premix, 1,000 mg, Intravenous, Q12H, von Caren Griffins, RPH, Stopped at 06/21/19 0911   Physical Exam: AOx3, FCx4 with full strength, speech fluent with normal content  Assessment & Plan: 34 y.o. woman s/p left crani for resection of meningioma, recovering well.  -MRI w/wo today to evaluate for extent of resection -okay for transfer to stepdown, okay to go unmonitored for  MRI -PT/OT recs -SCDs/TEDs, SQH on POD2  Judith Part  06/21/19 10:39 AM

## 2019-06-21 NOTE — Evaluation (Signed)
Physical Therapy Evaluation Patient Details Name: Kendra Schneider MRN: [id number] DOB: Dec 07, 1985 Today's Date: 06/21/2019   History of Present Illness  Pt is a 34 y/o female found to have L frontal meningioma, now s/p craniotomy/resection on 5/24. PMHx includes anxiety, asthma, depression, UTI  Clinical Impression  Patient presents with mobility limited due to decreased balance and decreased endurance with HR up to 125 with mobility.  She was able to negotiate steps appropriate for home entry with minguard A with 1 railing.  She has family support at d/c so no follow up PT recommended at this time.  Do feel she will benefit from continued skilled PT during acute stay.     Follow Up Recommendations No PT follow up    Equipment Recommendations  None recommended by PT    Recommendations for Other Services       Precautions / Restrictions Precautions Precautions: Fall Restrictions Weight Bearing Restrictions: No      Mobility  Bed Mobility               General bed mobility comments: up in recliner  Transfers Overall transfer level: Needs assistance Equipment used: None Transfers: Sit to/from Stand Sit to Stand: Supervision         General transfer comment: for lines, balance and safety  Ambulation/Gait Ambulation/Gait assistance: Min guard;Supervision Gait Distance (Feet): 250 Feet Assistive device: None Gait Pattern/deviations: Step-through pattern;Decreased stride length;Drifts right/left;Wide base of support     General Gait Details: increased BOS with ambulation and some veering to one side; mostly S assist, some minguard given for balance when veering  Stairs Stairs: Yes Stairs assistance: Min guard;Supervision Stair Management: Step to pattern;Alternating pattern;One rail Left Number of Stairs: 5 General stair comments: ascending with step through pattern and descending step to pattern, assist for safety  Wheelchair Mobility    Modified Rankin  (Stroke Patients Only)       Balance Overall balance assessment: Needs assistance Sitting-balance support: Feet supported Sitting balance-Leahy Scale: Good     Standing balance support: No upper extremity supported;During functional activity Standing balance-Leahy Scale: Fair Standing balance comment: static balance in bathroom without UE support needed, at times with dynamic balance needing S vs minguard                             Pertinent Vitals/Pain Pain Assessment: Faces Faces Pain Scale: Hurts little more Pain Location: head/incisional Pain Descriptors / Indicators: Discomfort;Sore Pain Intervention(s): Monitored during session;Repositioned    Home Living Family/patient expects to be discharged to:: Private residence Living Arrangements: Other relatives(aunt/uncle) Available Help at Discharge: Family;Available 24 hours/day Type of Home: Mobile home Home Access: Stairs to enter Entrance Stairs-Rails: Right;Left;Can reach both Entrance Stairs-Number of Steps: more than 8 pt unsure of how many specifically Home Layout: One level Home Equipment: Shower seat;Grab bars - tub/shower      Prior Function Level of Independence: Independent               Hand Dominance   Dominant Hand: Right    Extremity/Trunk Assessment   Upper Extremity Assessment Upper Extremity Assessment: Defer to OT evaluation    Lower Extremity Assessment Lower Extremity Assessment: Overall WFL for tasks assessed    Cervical / Trunk Assessment Cervical / Trunk Assessment: Other exceptions Cervical / Trunk Exceptions: edema present L side of head around incision with glasses not fitting well  Communication   Communication: No difficulties  Cognition Arousal/Alertness: Awake/alert Behavior During  Therapy: WFL for tasks assessed/performed Overall Cognitive Status: History of cognitive impairments - at baseline                                 General  Comments: pt reports difficulty with reading/writing at baseline, overall WFL for basic tasks and questions asked today       General Comments General comments (skin integrity, edema, etc.): HR max 125 with mobility,    Exercises     Assessment/Plan    PT Assessment Patient needs continued PT services  PT Problem List Decreased mobility;Decreased balance;Decreased knowledge of precautions;Decreased coordination;Cardiopulmonary status limiting activity       PT Treatment Interventions Gait training;Functional mobility training;Therapeutic exercise;Patient/family education;Therapeutic activities;Stair training;Balance training    PT Goals (Current goals can be found in the Care Plan section)  Acute Rehab PT Goals Patient Stated Goal: home when able  PT Goal Formulation: With patient Time For Goal Achievement: 07/05/19 Potential to Achieve Goals: Good    Frequency Min 3X/week   Barriers to discharge        Co-evaluation               AM-PAC PT "6 Clicks" Mobility  Outcome Measure Help needed turning from your back to your side while in a flat bed without using bedrails?: None Help needed moving from lying on your back to sitting on the side of a flat bed without using bedrails?: None Help needed moving to and from a bed to a chair (including a wheelchair)?: A Little Help needed standing up from a chair using your arms (e.g., wheelchair or bedside chair)?: A Little Help needed to walk in hospital room?: A Little Help needed climbing 3-5 steps with a railing? : A Little 6 Click Score: 20    End of Session Equipment Utilized During Treatment: Gait belt Activity Tolerance: Patient tolerated treatment well Patient left: in chair;with call bell/phone within reach   PT Visit Diagnosis: Other abnormalities of gait and mobility (R26.89)    Time: [id number] PT Time Calculation (min) (ACUTE ONLY): 23 min   Charges:   PT Evaluation $PT Eval Moderate Complexity: 1 Mod PT  Treatments $Gait Training: 8-22 mins        [name], [place] [phone] 06/21/2019   [name] 06/21/2019, 4:42 PM

## 2019-06-22 MED ORDER — HYDROCODONE-ACETAMINOPHEN 5-325 MG PO TABS
1.0000 | ORAL_TABLET | ORAL | Status: DC | PRN
  Administered 2019-06-22 – 2019-06-23 (×5): 2 via ORAL
  Filled 2019-06-22 (×6): qty 2

## 2019-06-22 NOTE — Progress Notes (Signed)
Facilitated call between patient's Aunt and Uncle with Dr. Zada Finders to discuss MRI results, all questions answered to family satisfaction.  Candy Sledge, RN

## 2019-06-22 NOTE — Progress Notes (Signed)
Neurosurgery Service Progress Note  Subjective: No acute events overnight, continued headaches today, L eye swelled shut, but otherwise feels well  Objective: Vitals:   06/21/19 2100 06/22/19 0000 06/22/19 0400 06/22/19 0748  BP: (!) 132/93 119/77 (!) 136/91   Pulse: (!) 103 86 91 85  Resp: (!) 30 (!) 22 19 20   Temp:  98.2 F (36.8 C) 98.6 F (37 C)   TempSrc:  Oral Oral   SpO2: 96% 97% 96% 100%  Weight:      Height:       Temp (24hrs), Avg:98.3 F (36.8 C), Min:97.6 F (36.4 C), Max:99 F (37.2 C)  CBC Latest Ref Rng & Units 06/21/2019 06/17/2019 02/21/2019  WBC 4.0 - 10.5 K/uL 13.8(H) 12.0(H) 13.8(H)  Hemoglobin 12.0 - 15.0 g/dL 10.9(L) 14.1 14.8  Hematocrit 36.0 - 46.0 % 34.8(L) 45.5 45.8  Platelets 150 - 400 K/uL 240 261 270   BMP Latest Ref Rng & Units 06/20/2019 02/21/2019 09/24/2016  Glucose 70 - 99 mg/dL 161(H) 77 -  BUN 6 - 20 mg/dL 10 12 15   Creatinine 0.44 - 1.00 mg/dL 0.79 0.83 0.8  BUN/Creat Ratio 9 - 23 - 14 -  Sodium 135 - 145 mmol/L 136 143 142  Potassium 3.5 - 5.1 mmol/L 4.0 4.0 4.4  Chloride 98 - 111 mmol/L 107 104 106  CO2 22 - 32 mmol/L 19(L) 20 16  Calcium 8.9 - 10.3 mg/dL 8.2(L) 9.3 9.0    Intake/Output Summary (Last 24 hours) at 06/22/2019 0754 Last data filed at 06/21/2019 2100 Gross per 24 hour  Intake 645.74 ml  Output -  Net 645.74 ml    Current Facility-Administered Medications:  .  acetaminophen (TYLENOL) tablet 650 mg, 650 mg, Oral, Q4H PRN **OR** acetaminophen (TYLENOL) suppository 650 mg, 650 mg, Rectal, Q4H PRN, Judith Part, MD .  Chlorhexidine Gluconate Cloth 2 % PADS 6 each, 6 each, Topical, Daily, Judith Part, MD, 6 each at 06/21/19 1003 .  docusate sodium (COLACE) capsule 100 mg, 100 mg, Oral, BID, Ostergard, Thomas A, MD, 100 mg at 06/21/19 2132 .  heparin injection 5,000 Units, 5,000 Units, Subcutaneous, Q8H, Ostergard, Joyice Faster, MD, 5,000 Units at 06/22/19 0500 .  HYDROcodone-acetaminophen (NORCO/VICODIN) 5-325 MG  per tablet 1 tablet, 1 tablet, Oral, Q4H PRN, Judith Part, MD, 1 tablet at 06/22/19 0438 .  HYDROmorphone (DILAUDID) injection 0.5 mg, 0.5 mg, Intravenous, Q3H PRN, Judith Part, MD, 0.5 mg at 06/22/19 0438 .  hydrOXYzine (ATARAX/VISTARIL) tablet 10 mg, 10 mg, Oral, QHS PRN, Judith Part, MD .  labetalol (NORMODYNE) injection 10-40 mg, 10-40 mg, Intravenous, Q10 min PRN, Judith Part, MD .  lactated ringers infusion, , Intravenous, Continuous, Duane Boston, MD, Stopped at 06/21/19 [id number] .  loratadine (CLARITIN) tablet 10 mg, 10 mg, Oral, Daily, Ostergard, Thomas A, MD, 10 mg at 06/21/19 1002 .  olopatadine (PATANOL) 0.1 % ophthalmic solution 1 drop, 1 drop, Both Eyes, BID PRN, Ostergard, Thomas A, MD .  ondansetron (ZOFRAN) tablet 4 mg, 4 mg, Oral, Q4H PRN **OR** ondansetron (ZOFRAN) injection 4 mg, 4 mg, Intravenous, Q4H PRN, Judith Part, MD, 4 mg at 06/20/19 1325 .  pantoprazole (PROTONIX) EC tablet 40 mg, 40 mg, Oral, Daily, Ostergard, Joyice Faster, MD, 40 mg at 06/21/19 1003 .  polyethylene glycol (MIRALAX / GLYCOLAX) packet 17 g, 17 g, Oral, Daily PRN, Ostergard, Thomas A, MD .  promethazine (PHENERGAN) tablet 12.5-25 mg, 12.5-25 mg, Oral, Q4H PRN, Judith Part, MD .  sertraline (ZOLOFT) tablet 50 mg, 50 mg, Oral, Daily, Ostergard, Joyice Faster, MD, 50 mg at 06/21/19 1003 .  umeclidinium bromide (INCRUSE ELLIPTA) 62.5 MCG/INH 1 puff, 1 puff, Inhalation, Daily, Ostergard, Joyice Faster, MD, 1 puff at 06/22/19 0748   Physical Exam: AOx3, FCx4 with full strength, speech fluent with normal content Incision c/d/i, L eye swollen shut  Assessment & Plan: 34 y.o. woman s/p left crani for resection of meningioma, recovering well. 5/25 MRI w/ GTR of meningioma  -4NP transfer pending -PT/OT rec home -will increase pain Rx given increased incisional pain -SCDs/TEDs, SQH today  Judith Part  06/22/19 7:54 AM

## 2019-06-22 NOTE — Progress Notes (Signed)
Physical Therapy Treatment Patient Details Name: Kendra Schneider MRN: [id number] DOB: [birth year] Today's Date: 06/22/2019    History of Present Illness Pt is a 34 y/o female found to have L frontal meningioma, now s/p craniotomy/resection on 5/24. PMHx includes anxiety, asthma, depression, UTI    PT Comments    Patient progressing slowly due to pain and continued swelling at crani site.  She reports vision is okay, but note some difficulty visualizing steps with descent.  She continues to benefit from skilled PT in the acute setting and likely will not need follow up PT with family support available at home.    Follow Up Recommendations  No PT follow up     Equipment Recommendations       Recommendations for Other Services       Precautions / Restrictions Precautions Precautions: Fall Restrictions Weight Bearing Restrictions: No    Mobility  Bed Mobility Overal bed mobility: Needs Assistance Bed Mobility: Supine to Sit;Sit to Supine     Supine to sit: Supervision Sit to supine: Supervision   General bed mobility comments: for lines and safety   Transfers Overall transfer level: Needs assistance Equipment used: None Transfers: Sit to/from Stand Sit to Stand: Supervision         General transfer comment: for lines, balance and safety  Ambulation/Gait Ambulation/Gait assistance: Supervision;Min guard   Assistive device: None Gait Pattern/deviations: Step-through pattern;Decreased stride length;Wide base of support     General Gait Details: no drifting noted today, but HR up to 130's and mild dyspnea noted   Stairs Stairs: Yes Stairs assistance: Supervision;Min guard Stair Management: Step to pattern;Alternating pattern;One rail Left Number of Stairs: 4 General stair comments: ascending with step through pattern and descending step to pattern, noted pt looking down over her mask to see steps with some increased time needed, assist for safety   Wheelchair  Mobility    Modified Rankin (Stroke Patients Only)       Balance Overall balance assessment: Needs assistance Sitting-balance support: Feet supported Sitting balance-Leahy Scale: Good     Standing balance support: No upper extremity supported;During functional activity Standing balance-Leahy Scale: Fair                              Cognition Arousal/Alertness: Awake/alert Behavior During Therapy: WFL for tasks assessed/performed Overall Cognitive Status: History of cognitive impairments - at baseline                                        Exercises      General Comments General comments (skin integrity, edema, etc.): HR 128 with activity      Pertinent Vitals/Pain Pain Assessment: 0-10 Pain Score: 10-Worst pain ever Faces Pain Scale: Hurts whole lot Pain Location: head/incisional Pain Descriptors / Indicators: Discomfort;Sore;Pressure Pain Intervention(s): Monitored during session;Repositioned;Ice applied    Home Living                      Prior Function            PT Goals (current goals can now be found in the care plan section) Acute Rehab PT Goals Patient Stated Goal: home when able , less pain Progress towards PT goals: Progressing toward goals    Frequency    Min 3X/week      PT Plan Current plan remains appropriate  Co-evaluation              AM-PAC PT "6 Clicks" Mobility   Outcome Measure  Help needed turning from your back to your side while in a flat bed without using bedrails?: None Help needed moving from lying on your back to sitting on the side of a flat bed without using bedrails?: None Help needed moving to and from a bed to a chair (including a wheelchair)?: A Little Help needed standing up from a chair using your arms (e.g., wheelchair or bedside chair)?: A Little Help needed to walk in hospital room?: A Little Help needed climbing 3-5 steps with a railing? : A Little 6 Click Score:  20    End of Session Equipment Utilized During Treatment: Gait belt Activity Tolerance: Patient tolerated treatment well Patient left: in bed;with call bell/phone within reach;with bed alarm set   PT Visit Diagnosis: Other abnormalities of gait and mobility (R26.89)     Time: [id number] PT Time Calculation (min) (ACUTE ONLY): 14 min  Charges:  $Gait Training: 8-22 mins                     [name], [place] [phone] 06/22/2019    [name] 06/22/2019, 5:40 PM

## 2019-06-22 NOTE — Progress Notes (Addendum)
Occupational Therapy Treatment Patient Details Name: Kendra Schneider MRN: [place]:[id number] DOB: [birth year] Today's Date: 06/22/2019    History of present illness Pt is a 34 y/o female found to have L frontal meningioma, now s/p craniotomy/resection on 5/24. PMHx includes anxiety, asthma, depression, UTI   OT comments  Pt with increased pain and swelling at incision today, increased ptosis to L eye due to edema. Despite pain levels pt tolerating room level mobility without AD and standing grooming ADL overall at supervision level. Pt reports no increase in pain levels from supine vs sitting or with standing activity. BP start of session 141/78, end of session 132/84 and max HR 121 with activity. Provided ice to aide in pain/edema reduction end of session. Despite pain pt mobilizing fairly well, feel POC remains appropriate at this time. Will continue to follow while acutely admitted.    Follow Up Recommendations  No OT follow up;Supervision/Assistance - 24 hour(24hr initially)    Equipment Recommendations  None recommended by OT          Precautions / Restrictions Precautions Precautions: Fall Restrictions Weight Bearing Restrictions: No       Mobility Bed Mobility Overal bed mobility: Needs Assistance Bed Mobility: Supine to Sit;Sit to Supine     Supine to sit: Supervision Sit to supine: Supervision   General bed mobility comments: for lines and safety   Transfers Overall transfer level: Needs assistance Equipment used: None Transfers: Sit to/from Stand Sit to Stand: Supervision         General transfer comment: for lines, balance and safety    Balance Overall balance assessment: Needs assistance Sitting-balance support: Feet supported Sitting balance-Leahy Scale: Good     Standing balance support: No upper extremity supported;During functional activity Standing balance-Leahy Scale: Fair                             ADL either performed or assessed with  clinical judgement   ADL Overall ADL's : Needs assistance/impaired     Grooming: Oral care;Wash/dry hands;Supervision/safety;Standing Grooming Details (indicate cue type and reason): supervision to ensure balance and safety. pt able to locate items and sequence through task without difficulty                              Functional mobility during ADLs: Supervision/safety;Min guard General ADL Comments: pt with increased pain levels today     Vision       Perception     Praxis      Cognition Arousal/Alertness: Awake/alert Behavior During Therapy: WFL for tasks assessed/performed Overall Cognitive Status: History of cognitive impairments - at baseline                                          Exercises     Shoulder Instructions       General Comments max HR 121 with activity    Pertinent Vitals/ Pain       Pain Assessment: 0-10 Pain Score: 10-Worst pain ever Pain Location: head/incisional Pain Descriptors / Indicators: Discomfort;Sore Pain Intervention(s): Monitored during session;Premedicated before session;Ice applied(ice for facial swelling)  Home Living  Prior Functioning/Environment              Frequency  Min 2X/week        Progress Toward Goals  OT Goals(current goals can now be found in the care plan section)  Progress towards OT goals: Progressing toward goals  Acute Rehab OT Goals Patient Stated Goal: home when able , less pain OT Goal Formulation: With patient Time For Goal Achievement: 07/05/19 Potential to Achieve Goals: Good ADL Goals Pt Will Perform Grooming: with modified independence;standing Pt Will Perform Upper Body Dressing: with modified independence;sitting Pt Will Transfer to Toilet: with modified independence;ambulating Pt Will Perform Toileting - Clothing Manipulation and hygiene: with modified independence;sit to/from stand  Plan  Discharge plan remains appropriate    Co-evaluation                 AM-PAC OT "6 Clicks" Daily Activity     Outcome Measure   Help from another person eating meals?: None Help from another person taking care of personal grooming?: A Little Help from another person toileting, which includes using toliet, bedpan, or urinal?: A Little Help from another person bathing (including washing, rinsing, drying)?: A Little Help from another person to put on and taking off regular upper body clothing?: A Little Help from another person to put on and taking off regular lower body clothing?: A Little 6 Click Score: 19    End of Session Equipment Utilized During Treatment: Gait belt  OT Visit Diagnosis: Other abnormalities of gait and mobility (R26.89);Other symptoms and signs involving the nervous system (R29.898);Pain Pain - part of body: (head)   Activity Tolerance Patient tolerated treatment well;Patient limited by pain   Patient Left in bed;with call bell/phone within reach;with bed alarm set   Nurse Communication Mobility status        Time: [id number] OT Time Calculation (min): 22 min  Charges: OT General Charges $OT Visit: 1 Visit OT Treatments $Self Care/Home Management : 8-22 mins  Lou Cal, OT Acute Rehabilitation Services Pager [phone] Office [phone]   Raymondo Band 06/22/2019, 2:06 PM

## 2019-06-23 MED ORDER — OXYCODONE HCL 5 MG PO TABS: 5.0000 mg | ORAL_TABLET | ORAL | Status: DC | PRN

## 2019-06-23 MED ORDER — OXYCODONE HCL 5 MG PO TABS
10.0000 mg | ORAL_TABLET | ORAL | Status: DC | PRN
  Administered 2019-06-23 – 2019-06-24 (×7): 10 mg via ORAL
  Filled 2019-06-23 (×7): qty 2

## 2019-06-23 NOTE — Progress Notes (Signed)
Neurosurgery Service Progress Note  Subjective: No acute events overnight, stable Sx from yesterday  Objective: Vitals:   06/22/19 2100 06/23/19 0008 06/23/19 0010 06/23/19 0426  BP: 135/79  (!) 143/89 (!) 141/96  Pulse: (!) 111   88  Resp: (!) 21  (!) 27 (!) 23  Temp:  98.3 F (36.8 C)  97.7 F (36.5 C)  TempSrc:  Oral Oral Oral  SpO2: 97%   90%  Weight:      Height:       Temp (24hrs), Avg:97.9 F (36.6 C), Min:97.6 F (36.4 C), Max:98.3 F (36.8 C)  CBC Latest Ref Rng & Units 06/21/2019 06/17/2019 02/21/2019  WBC 4.0 - 10.5 K/uL 13.8(H) 12.0(H) 13.8(H)  Hemoglobin 12.0 - 15.0 g/dL 10.9(L) 14.1 14.8  Hematocrit 36.0 - 46.0 % 34.8(L) 45.5 45.8  Platelets 150 - 400 K/uL 240 261 270   BMP Latest Ref Rng & Units 06/20/2019 02/21/2019 09/24/2016  Glucose 70 - 99 mg/dL 161(H) 77 -  BUN 6 - 20 mg/dL 10 12 15   Creatinine 0.44 - 1.00 mg/dL 0.79 0.83 0.8  BUN/Creat Ratio 9 - 23 - 14 -  Sodium 135 - 145 mmol/L 136 143 142  Potassium 3.5 - 5.1 mmol/L 4.0 4.0 4.4  Chloride 98 - 111 mmol/L 107 104 106  CO2 22 - 32 mmol/L 19(L) 20 16  Calcium 8.9 - 10.3 mg/dL 8.2(L) 9.3 9.0    Intake/Output Summary (Last 24 hours) at 06/23/2019 0755 Last data filed at 06/22/2019 1900 Gross per 24 hour  Intake 960 ml  Output --  Net 960 ml    Current Facility-Administered Medications:  .  acetaminophen (TYLENOL) tablet 650 mg, 650 mg, Oral, Q4H PRN, 650 mg at 06/22/19 1100 **OR** acetaminophen (TYLENOL) suppository 650 mg, 650 mg, Rectal, Q4H PRN, Judith Part, MD .  Chlorhexidine Gluconate Cloth 2 % PADS 6 each, 6 each, Topical, Daily, Judith Part, MD, 6 each at 06/21/19 1003 .  docusate sodium (COLACE) capsule 100 mg, 100 mg, Oral, BID, Judith Part, MD, 100 mg at 06/22/19 2058 .  heparin injection 5,000 Units, 5,000 Units, Subcutaneous, Q8H, Marivel Mcclarty, Joyice Faster, MD, 5,000 Units at 06/23/19 0503 .  HYDROmorphone (DILAUDID) injection 0.5 mg, 0.5 mg, Intravenous, Q3H PRN,  Judith Part, MD, 0.5 mg at 06/23/19 0606 .  hydrOXYzine (ATARAX/VISTARIL) tablet 10 mg, 10 mg, Oral, QHS PRN, Deyana Wnuk A, MD .  labetalol (NORMODYNE) injection 10-40 mg, 10-40 mg, Intravenous, Q10 min PRN, Judith Part, MD .  lactated ringers infusion, , Intravenous, Continuous, Duane Boston, MD, Stopped at 06/21/19 [phone] .  loratadine (CLARITIN) tablet 10 mg, 10 mg, Oral, Daily, Roberto Romanoski A, MD, 10 mg at 06/22/19 1057 .  olopatadine (PATANOL) 0.1 % ophthalmic solution 1 drop, 1 drop, Both Eyes, BID PRN, Baby Stairs A, MD .  ondansetron (ZOFRAN) tablet 4 mg, 4 mg, Oral, Q4H PRN **OR** ondansetron (ZOFRAN) injection 4 mg, 4 mg, Intravenous, Q4H PRN, Judith Part, MD, 4 mg at 06/20/19 1325 .  oxyCODONE (Oxy IR/ROXICODONE) immediate release tablet 10 mg, 10 mg, Oral, Q4H PRN, Danielys Madry A, MD .  oxyCODONE (Oxy IR/ROXICODONE) immediate release tablet 5 mg, 5 mg, Oral, Q4H PRN, Dimitria Ketchum A, MD .  pantoprazole (PROTONIX) EC tablet 40 mg, 40 mg, Oral, Daily, Silus Lanzo A, MD, 40 mg at 06/22/19 1057 .  polyethylene glycol (MIRALAX / GLYCOLAX) packet 17 g, 17 g, Oral, Daily PRN, Judith Part, MD .  promethazine (PHENERGAN) tablet  12.5-25 mg, 12.5-25 mg, Oral, Q4H PRN, Judith Part, MD .  sertraline (ZOLOFT) tablet 50 mg, 50 mg, Oral, Daily, Minha Fulco A, MD, 50 mg at 06/22/19 1057 .  umeclidinium bromide (INCRUSE ELLIPTA) 62.5 MCG/INH 1 puff, 1 puff, Inhalation, Daily, Isis Costanza, Joyice Faster, MD, 1 puff at 06/22/19 0748   Physical Exam: AOx3, FCx4 with full strength, speech fluent with normal content Incision c/d/i, L eye swollen shut  Assessment & Plan: 34 y.o. woman s/p left crani for resection of meningioma, recovering well. 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

## 2019-06-24 MED ORDER — OXYCODONE HCL 5 MG PO TABS: 5.0000 mg | ORAL_TABLET | ORAL | 0 refills | Status: DC | PRN

## 2019-06-24 NOTE — Discharge Instructions (Signed)
Discharge Instructions  No restriction in activities, slowly increase your activity back to normal.   Your incision is closed with absorbable sutures. These will naturally fall off over the next 4-6 weeks. If they become bothersome or cause discomfort, apply some antibiotic ointment like bacitracin or neosporin on the sutures. This will soften them up and usually makes them more comfortable while they dissolve.  Okay to shower on the day of discharge. Be gentle when cleaning your incision. Use regular soap and water. If that is uncomfortable, try using baby shampoo. Do not submerge the wound under water for 2 weeks after surgery.  Follow up with Dr. Aurelius Gildersleeve in 2 weeks after discharge. If you do not already have a discharge appointment, please call his office at [phone] to schedule a follow up appointment. If you have any concerns or questions, please call the office and let us know. 

## 2019-06-24 NOTE — Progress Notes (Signed)
Occupational Therapy Treatment Patient Details Name: Kendra Schneider MRN: [id number] DOB: [birth year] Today's Date: 06/24/2019    History of present illness Pt is a 34 y/o female found to have L frontal meningioma, now s/p craniotomy/resection on 5/24. PMHx includes anxiety, asthma, depression, UTI   OT comments  Pt presents seated in recliner agreeable to working with OT. Pt reports having completed bathing/dressing ADL this AM with NT assist in prep for d/c home today - she reports able to perform without significant difficulty. Practiced this session simulated tub transfer (simulated in room). Pt able to perform with minguard assist and use of bil UE support (pt reports has grab bar for transfer use at home). Pt reports continued pain at incision site but reports improvements since most recent session. She continues to have some blurred vision in L eye which she reports is also improving - able to read larger print across room today. Will continue per POC at this time.   Follow Up Recommendations  No OT follow up;Supervision/Assistance - 24 hour(24hr initially)    Equipment Recommendations  None recommended by OT          Precautions / Restrictions Precautions Precautions: Fall Restrictions Weight Bearing Restrictions: No       Mobility Bed Mobility               General bed mobility comments: OOB in recliner upon arrival, left in recliner end of session   Transfers Overall transfer level: Needs assistance Equipment used: None Transfers: Sit to/from Stand Sit to Stand: Supervision         General transfer comment: for safety    Balance Overall balance assessment: Needs assistance Sitting-balance support: Feet supported Sitting balance-Leahy Scale: Good     Standing balance support: No upper extremity supported;During functional activity Standing balance-Leahy Scale: Fair                             ADL either performed or assessed with clinical  judgement   ADL Overall ADL's : Needs assistance/impaired                                 Tub/ Shower Transfer: Min guard;Tub transfer;Ambulation Tub/Shower Transfer Details (indicate cue type and reason): minguard for safety, simulated in room; pt reports she typically takes baths at home - reinforced not soaking her head but only to let water/soap run over incision with pt verbalizing understanding  Functional mobility during ADLs: Supervision/safety General ADL Comments: pt already bathed and dressed this AM with assist from NT in prep for d/c home, reports pain has improved since last session, swelling over L eye appears better today     Vision   Additional Comments: pt reports some continued blurring of vision but has improved, reports had dipolopia prior to sx but denies post sx. able to read larger numbers across room and numbers on call bell    Perception     Praxis      Cognition Arousal/Alertness: Awake/alert Behavior During Therapy: [facility] for tasks assessed/performed Overall Cognitive Status: History of cognitive impairments - at baseline                                          Exercises     Shoulder Instructions  General Comments      Pertinent Vitals/ Pain       Pain Assessment: Faces Faces Pain Scale: Hurts even more Pain Location: head/incisional Pain Descriptors / Indicators: Discomfort;Sore;Pressure Pain Intervention(s): Monitored during session  Home Living                                          Prior Functioning/Environment              Frequency  Min 2X/week        Progress Toward Goals  OT Goals(current goals can now be found in the care plan section)  Progress towards OT goals: Progressing toward goals  Acute Rehab OT Goals Patient Stated Goal: planning for home today OT Goal Formulation: With patient Time For Goal Achievement: 07/05/19 Potential to Achieve Goals: Good ADL  Goals Pt Will Perform Grooming: with modified independence;standing Pt Will Perform Upper Body Dressing: with modified independence;sitting Pt Will Transfer to Toilet: with modified independence;ambulating Pt Will Perform Toileting - Clothing Manipulation and hygiene: with modified independence;sit to/from stand  Plan Discharge plan remains appropriate    Co-evaluation                 AM-PAC OT "6 Clicks" Daily Activity     Outcome Measure   Help from another person eating meals?: None Help from another person taking care of personal grooming?: A Little Help from another person toileting, which includes using toliet, bedpan, or urinal?: A Little Help from another person bathing (including washing, rinsing, drying)?: A Little Help from another person to put on and taking off regular upper body clothing?: A Little Help from another person to put on and taking off regular lower body clothing?: A Little 6 Click Score: 19    End of Session    OT Visit Diagnosis: Other abnormalities of gait and mobility (R26.89);Other symptoms and signs involving the nervous system (R29.898);Pain Pain - part of body: (head)   Activity Tolerance Patient tolerated treatment well   Patient Left in chair;with call bell/phone within reach   Nurse Communication Mobility status        Time: [id number] OT Time Calculation (min): 12 min  Charges: OT General Charges $OT Visit: 1 Visit OT Treatments $Self Care/Home Management : 8-22 mins  Lou Cal, OT Acute Rehabilitation Services Pager [phone] Office [phone]    Raymondo Band 06/24/2019, 11:23 AM

## 2019-06-24 NOTE — Progress Notes (Signed)
Neurosurgery Service Progress Note  Subjective: No acute events overnight, feels better today  Objective: Vitals:   06/24/19 0217 06/24/19 0234 06/24/19 0744 06/24/19 0746  BP: (!) 138/92   (!) 136/95  Pulse: (!) 114 99 96 90  Resp: (!) 26 (!) 24 18 16   Temp: 98.6 F (37 C)   97.6 F (36.4 C)  TempSrc: Oral   Oral  SpO2: 99% 92% 100% 94%  Weight:      Height:       Temp (24hrs), Avg:98.1 F (36.7 C), Min:97.6 F (36.4 C), Max:98.9 F (37.2 C)  CBC Latest Ref Rng & Units 06/21/2019 06/17/2019 02/21/2019  WBC 4.0 - 10.5 K/uL 13.8(H) 12.0(H) 13.8(H)  Hemoglobin 12.0 - 15.0 g/dL 10.9(L) 14.1 14.8  Hematocrit 36.0 - 46.0 % 34.8(L) 45.5 45.8  Platelets 150 - 400 K/uL 240 261 270   BMP Latest Ref Rng & Units 06/20/2019 02/21/2019 09/24/2016  Glucose 70 - 99 mg/dL 161(H) 77 -  BUN 6 - 20 mg/dL 10 12 15   Creatinine 0.44 - 1.00 mg/dL 0.79 0.83 0.8  BUN/Creat Ratio 9 - 23 - 14 -  Sodium 135 - 145 mmol/L 136 143 142  Potassium 3.5 - 5.1 mmol/L 4.0 4.0 4.4  Chloride 98 - 111 mmol/L 107 104 106  CO2 22 - 32 mmol/L 19(L) 20 16  Calcium 8.9 - 10.3 mg/dL 8.2(L) 9.3 9.0   No intake or output data in the 24 hours ending 06/24/19 0849  Current Facility-Administered Medications:  .  acetaminophen (TYLENOL) tablet 650 mg, 650 mg, Oral, Q4H PRN, 650 mg at 06/22/19 1100 **OR** acetaminophen (TYLENOL) suppository 650 mg, 650 mg, Rectal, Q4H PRN, Judith Part, MD .  Chlorhexidine Gluconate Cloth 2 % PADS 6 each, 6 each, Topical, Daily, Judith Part, MD, 6 each at 06/23/19 1002 .  docusate sodium (COLACE) capsule 100 mg, 100 mg, Oral, BID, Oswald Pott A, MD, 100 mg at 06/23/19 2209 .  heparin injection 5,000 Units, 5,000 Units, Subcutaneous, Q8H, Judith Part, MD, 5,000 Units at 06/24/19 0445 .  HYDROmorphone (DILAUDID) injection 0.5 mg, 0.5 mg, Intravenous, Q3H PRN, Judith Part, MD, 0.5 mg at 06/23/19 0606 .  hydrOXYzine (ATARAX/VISTARIL) tablet 10 mg, 10 mg, Oral,  QHS PRN, Judith Campillo A, MD .  labetalol (NORMODYNE) injection 10-40 mg, 10-40 mg, Intravenous, Q10 min PRN, Judith Part, MD .  lactated ringers infusion, , Intravenous, Continuous, Duane Boston, MD, Stopped at 06/21/19 [phone] .  loratadine (CLARITIN) tablet 10 mg, 10 mg, Oral, Daily, Brenten Janney A, MD, 10 mg at 06/23/19 1002 .  olopatadine (PATANOL) 0.1 % ophthalmic solution 1 drop, 1 drop, Both Eyes, BID PRN, Caellum Mancil A, MD .  ondansetron (ZOFRAN) tablet 4 mg, 4 mg, Oral, Q4H PRN **OR** ondansetron (ZOFRAN) injection 4 mg, 4 mg, Intravenous, Q4H PRN, Judith Part, MD, 4 mg at 06/20/19 1325 .  oxyCODONE (Oxy IR/ROXICODONE) immediate release tablet 10 mg, 10 mg, Oral, Q4H PRN, Judith Part, MD, 10 mg at 06/24/19 [id number] .  oxyCODONE (Oxy IR/ROXICODONE) immediate release tablet 5 mg, 5 mg, Oral, Q4H PRN, Deisi Salonga A, MD .  pantoprazole (PROTONIX) EC tablet 40 mg, 40 mg, Oral, Daily, Rachard Isidro A, MD, 40 mg at 06/23/19 1002 .  polyethylene glycol (MIRALAX / GLYCOLAX) packet 17 g, 17 g, Oral, Daily PRN, Verniece Encarnacion A, MD .  promethazine (PHENERGAN) tablet 12.5-25 mg, 12.5-25 mg, Oral, Q4H PRN, Judith Part, MD .  sertraline (ZOLOFT) tablet 50  mg, 50 mg, Oral, Daily, Judith Part, MD, 50 mg at 06/23/19 1002 .  umeclidinium bromide (INCRUSE ELLIPTA) 62.5 MCG/INH 1 puff, 1 puff, Inhalation, Daily, Alexandre Faries, Joyice Faster, MD, 1 puff at 06/24/19 0744   Physical Exam: AOx3, FCx4 with full strength, speech fluent with normal content Incision c/d/i, L periorbital edema improving  Assessment & Plan: 34 y.o. woman s/p left crani for resection of meningioma, recovering well. 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

## 2019-06-24 NOTE — Progress Notes (Signed)
Patient and Kendra Schneider was given discharge papers and educated. Patient and Aunt verbalized understanding of the discharge information. IV was removed from the left arm patient tolerated well. All questions have been answered and patients discharge is complete.

## 2019-06-24 NOTE — Progress Notes (Signed)
Physical Therapy Treatment Patient Details Name: Kendra Schneider MRN: [id number] DOB: 1985/12/12 Today's Date: 06/24/2019    History of Present Illness Pt is a 34 y/o female found to have L frontal meningioma, now s/p craniotomy/resection on 5/24. PMHx includes anxiety, asthma, depression, UTI    PT Comments    Pt tolerates treatment well, ambulating for limited community distances. Pt does continue to demonstrate increased sway during ambulation but does not demonstrate any losses of balance. Pt with improved stair negotiation, able to better see the stairs when descending. Pt will continue to benefit from acute PT services to further improve balance and restore independent mobility.   Follow Up Recommendations  No PT follow up     Equipment Recommendations  None recommended by PT    Recommendations for Other Services       Precautions / Restrictions Precautions Precautions: Fall Restrictions Weight Bearing Restrictions: No    Mobility  Bed Mobility               General bed mobility comments: OOB in recliner upon arrival, left in recliner end of session   Transfers Overall transfer level: Needs assistance Equipment used: None Transfers: Sit to/from Stand Sit to Stand: Supervision         General transfer comment: for safety  Ambulation/Gait Ambulation/Gait assistance: Supervision Gait Distance (Feet): 300 Feet Assistive device: None Gait Pattern/deviations: Step-through pattern Gait velocity: functional Gait velocity interpretation: 1.31 - 2.62 ft/sec, indicative of limited community ambulator General Gait Details: pt with increased lateral sway and drift but no losses of balance noted   Stairs Stairs: Yes Stairs assistance: Supervision Stair Management: One rail Left Number of Stairs: 7 General stair comments: ascending with step through and descending with step to pattern. Pt reports improved vision as L eye is less swollen   Wheelchair Mobility     Modified Rankin (Stroke Patients Only)       Balance Overall balance assessment: Needs assistance Sitting-balance support: No upper extremity supported;Feet supported Sitting balance-Leahy Scale: Good Sitting balance - Comments: independent for static sitting balance   Standing balance support: No upper extremity supported;During functional activity Standing balance-Leahy Scale: Good Standing balance comment: supervision                            Cognition Arousal/Alertness: Awake/alert Behavior During Therapy: WFL for tasks assessed/performed Overall Cognitive Status: History of cognitive impairments - at baseline                                 General Comments: overall WFL for tasks assessed this session      Exercises      General Comments General comments (skin integrity, edema, etc.): VSS on RA      Pertinent Vitals/Pain Pain Assessment: Faces Faces Pain Scale: Hurts a little bit Pain Location: head Pain Descriptors / Indicators: Grimacing Pain Intervention(s): Monitored during session    Home Living                      Prior Function            PT Goals (current goals can now be found in the care plan section) Acute Rehab PT Goals Patient Stated Goal: planning for home today Progress towards PT goals: Progressing toward goals    Frequency    Min 3X/week      PT Plan  Current plan remains appropriate    Co-evaluation              AM-PAC PT "6 Clicks" Mobility   Outcome Measure  Help needed turning from your back to your side while in a flat bed without using bedrails?: None Help needed moving from lying on your back to sitting on the side of a flat bed without using bedrails?: None Help needed moving to and from a bed to a chair (including a wheelchair)?: None Help needed standing up from a chair using your arms (e.g., wheelchair or bedside chair)?: None Help needed to walk in hospital room?:  None Help needed climbing 3-5 steps with a railing? : A Little 6 Click Score: 23    End of Session   Activity Tolerance: Patient tolerated treatment well Patient left: in chair;with call bell/phone within reach Nurse Communication: Mobility status PT Visit Diagnosis: Other abnormalities of gait and mobility (R26.89)     Time: [id number] PT Time Calculation (min) (ACUTE ONLY): 9 min  Charges:  $Gait Training: 8-22 mins                     Zenaida Niece, PT, DPT Acute Rehabilitation Pager: [phone]    Zenaida Niece 06/24/2019, 1:12 PM

## 2019-06-24 NOTE — Discharge Summary (Signed)
Discharge Summary  Date of Admission: 06/20/2019  Date of Discharge: 06/24/19  Attending Physician: Emelda Brothers, MD  Hospital Course: Patient was admitted following an uncomplicated left craniotomy for resection of a meningioma. She was recovered in PACU and transferred to 4N. Her hospital course was uncomplicated, a post-op MRI showed gross total resection of the largest of her meningiomas, and the patient was discharged home on 06/24/19. She will follow up in clinic with me in 2 weeks.  Neurologic exam at discharge:  AOx3, PERRL, EOMI, FS, TM Strength 5/5 x4, SILTx4, no drift  Discharge diagnosis: Intracranial tumor / meningioma  Judith Part, MD 06/24/19 8:52 AM

## 2019-07-28 ENCOUNTER — Ambulatory Visit (INDEPENDENT_AMBULATORY_CARE_PROVIDER_SITE_OTHER): Payer: Medicaid Other | Admitting: *Deleted

## 2019-07-28 DIAGNOSIS — Z3042 Encounter for surveillance of injectable contraceptive: Secondary | ICD-10-CM

## 2019-07-28 DIAGNOSIS — Z7689 Persons encountering health services in other specified circumstances: Secondary | ICD-10-CM

## 2019-07-28 MED ORDER — MEDROXYPROGESTERONE ACETATE 150 MG/ML IM SUSP
150.0000 mg | Freq: Once | INTRAMUSCULAR | Status: AC
  Administered 2019-07-28: 150 mg via INTRAMUSCULAR

## 2019-07-28 NOTE — Progress Notes (Signed)
   NURSE VISIT- INJECTION  SUBJECTIVE:  Kendra Schneider is a 34 y.o. G0P0 female here for a Depo Provera for contraception/period management. She is a GYN patient.   OBJECTIVE:  There were no vitals taken for this visit.  Appears well, in no apparent distress  Injection administered in: Right deltoid  Meds ordered this encounter  Medications  . medroxyPROGESTERone (DEPO-PROVERA) injection 150 mg    ASSESSMENT: GYN patient Depo Provera for contraception/period management PLAN: Follow-up: in 11-13 weeks for next Depo   Kendra Schneider  07/28/2019  2:05 PM

## 2019-09-19 ENCOUNTER — Encounter: Payer: Self-pay | Admitting: Family Medicine

## 2019-09-19 ENCOUNTER — Ambulatory Visit: Payer: Medicaid Other | Admitting: Family Medicine

## 2019-09-19 VITALS — BP 131/86 | HR 94 | Ht 59.0 in | Wt 235.0 lb

## 2019-09-19 DIAGNOSIS — Z9889 Other specified postprocedural states: Secondary | ICD-10-CM | POA: Diagnosis not present

## 2019-09-19 DIAGNOSIS — G8929 Other chronic pain: Secondary | ICD-10-CM

## 2019-09-19 DIAGNOSIS — D429 Neoplasm of uncertain behavior of meninges, unspecified: Secondary | ICD-10-CM | POA: Diagnosis not present

## 2019-09-19 DIAGNOSIS — R519 Headache, unspecified: Secondary | ICD-10-CM | POA: Diagnosis not present

## 2019-09-19 DIAGNOSIS — Z86018 Personal history of other benign neoplasm: Secondary | ICD-10-CM | POA: Diagnosis not present
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
## 2019-09-19 NOTE — Patient Instructions (Signed)
I am so glad you are feeling better!!!!  Please continue follow up with Dr Zada Finders as directed. Follow up with me as needed.   General Headache Without Cause A headache is pain or discomfort that is felt around the head or neck area. There are many causes and types of headaches. In some cases, the cause may not be found. Follow these instructions at home: Watch your condition for any changes. Let your doctor know about them. Take these steps to help with your condition: Managing pain      Take over-the-counter and prescription medicines only as told by your doctor.  Lie down in a dark, quiet room when you have a headache.  If told, put ice on your head and neck area: ? Put ice in a plastic bag. ? Place a towel between your skin and the bag. ? Leave the ice on for 20 minutes, 2-3 times per day.  If told, put heat on the affected area. Use the heat source that your doctor recommends, such as a moist heat pack or a heating pad. ? Place a towel between your skin and the heat source. ? Leave the heat on for 20-30 minutes. ? Remove the heat if your skin turns bright red. This is very important if you are unable to feel pain, heat, or cold. You may have a greater risk of getting burned.  Keep lights dim if bright lights bother you or make your headaches worse. Eating and drinking  Eat meals on a regular schedule.  If you drink alcohol: ? Limit how much you use to:  0-1 drink a day for women.  0-2 drinks a day for men. ? Be aware of how much alcohol is in your drink. In the [place] one drink equals one 12 oz bottle of beer (355 mL), one 5 oz glass of wine (148 mL), or one 1 oz glass of hard liquor (44 mL).  Stop drinking caffeine, or reduce how much caffeine you drink. General instructions   Keep a journal to find out if certain things bring on headaches. For example, write down: ? What you eat and drink. ? How much sleep you get. ? Any change to your diet or  medicines.  Get a massage or try other ways to relax.  Limit stress.  Sit up straight. Do not tighten (tense) your muscles.  Do not use any products that contain nicotine or tobacco. This includes cigarettes, e-cigarettes, and chewing tobacco. If you need help quitting, ask your doctor.  Exercise regularly as told by your doctor.  Get enough sleep. This often means 7-9 hours of sleep each night.  Keep all follow-up visits as told by your doctor. This is important. Contact a doctor if:  Your symptoms are not helped by medicine.  You have a headache that feels different than the other headaches.  You feel sick to your stomach (nauseous) or you throw up (vomit).  You have a fever. Get help right away if:  Your headache gets very bad quickly.  Your headache gets worse after a lot of physical activity.  You keep throwing up.  You have a stiff neck.  You have trouble seeing.  You have trouble speaking.  You have pain in the eye or ear.  Your muscles are weak or you lose muscle control.  You lose your balance or have trouble walking.  You feel like you will pass out (faint) or you pass out.  You are mixed up (confused).  You  have a seizure. Summary  A headache is pain or discomfort that is felt around the head or neck area.  There are many causes and types of headaches. In some cases, the cause may not be found.  Keep a journal to help find out what causes your headaches. Watch your condition for any changes. Let your doctor know about them.  Contact a doctor if you have a headache that is different from usual, or if your headache is not helped by medicine.  Get help right away if your headache gets very bad, you throw up, you have trouble seeing, you lose your balance, or you have a seizure. This information is not intended to replace advice given to you by your health care provider. Make sure you discuss any questions you have with your health care  provider. Document Revised: 08/03/2017 Document Reviewed: 08/03/2017 Elsevier Patient Education  [place].

## 2019-10-24 ENCOUNTER — Other Ambulatory Visit: Payer: Self-pay | Admitting: Critical Care Medicine

## 2019-10-24 ENCOUNTER — Other Ambulatory Visit: Payer: Medicaid Other

## 2019-10-24 ENCOUNTER — Ambulatory Visit (INDEPENDENT_AMBULATORY_CARE_PROVIDER_SITE_OTHER): Payer: Medicaid Other | Admitting: *Deleted

## 2019-10-24 DIAGNOSIS — Z20822 Contact with and (suspected) exposure to covid-19: Secondary | ICD-10-CM

## 2019-10-24 DIAGNOSIS — Z3042 Encounter for surveillance of injectable contraceptive: Secondary | ICD-10-CM

## 2019-10-24 MED ORDER — MEDROXYPROGESTERONE ACETATE 150 MG/ML IM SUSP
150.0000 mg | Freq: Once | INTRAMUSCULAR | Status: AC
  Administered 2019-10-24: 150 mg via INTRAMUSCULAR

## 2019-10-24 NOTE — Progress Notes (Signed)
   NURSE VISIT- INJECTION  SUBJECTIVE:  Kendra Schneider is a 34 y.o. G0P0 female here for a Depo Provera for contraception/period management. She is a GYN patient.   OBJECTIVE:  There were no vitals taken for this visit.  Appears well, in no apparent distress  Injection administered in: Left deltoid  Meds ordered this encounter  Medications  . medroxyPROGESTERone (DEPO-PROVERA) injection 150 mg    ASSESSMENT: GYN patient Depo Provera for contraception/period management PLAN: Follow-up: in 11-13 weeks for next Depo   Levy Pupa  10/24/2019 2:51 PM

## 2019-10-25 LAB — SPECIMEN STATUS REPORT

## 2019-10-25 LAB — SARS-COV-2, NAA 2 DAY TAT

## 2019-10-25 LAB — NOVEL CORONAVIRUS, NAA: SARS-CoV-2, NAA: NOT DETECTED

## 2019-10-26 ENCOUNTER — Telehealth: Payer: Self-pay

## 2019-10-26 NOTE — Telephone Encounter (Signed)
Guardian given COVID 19 results, verbalizes understanding.

## 2020-01-24 ENCOUNTER — Ambulatory Visit (INDEPENDENT_AMBULATORY_CARE_PROVIDER_SITE_OTHER): Payer: Medicaid Other

## 2020-01-24 ENCOUNTER — Other Ambulatory Visit: Payer: Self-pay

## 2020-01-24 DIAGNOSIS — Z3042 Encounter for surveillance of injectable contraceptive: Secondary | ICD-10-CM

## 2020-01-24 MED ORDER — MEDROXYPROGESTERONE ACETATE 150 MG/ML IM SUSP
150.0000 mg | Freq: Once | INTRAMUSCULAR | Status: AC
  Administered 2020-01-24: 150 mg via INTRAMUSCULAR

## 2020-01-24 NOTE — Progress Notes (Signed)
   NURSE VISIT- INJECTION  SUBJECTIVE:  Kendra Schneider is a 34 y.o. G0P0 female here for a Depo Provera for contraception/period management. She is a GYN patient.   OBJECTIVE:  There were no vitals taken for this visit.  Appears well, in no apparent distress  Injection administered in: Right deltoid  Meds ordered this encounter  Medications  . medroxyPROGESTERone (DEPO-PROVERA) injection 150 mg    ASSESSMENT: GYN patient Depo Provera for contraception/period management PLAN: Follow-up: in 11-13 weeks for next Depo   Tor Tsuda A Nakaya Mishkin  01/24/2020 2:38 PM

## 2020-04-09 ENCOUNTER — Other Ambulatory Visit: Payer: Self-pay | Admitting: Adult Health

## 2020-04-23 ENCOUNTER — Ambulatory Visit (INDEPENDENT_AMBULATORY_CARE_PROVIDER_SITE_OTHER): Payer: Medicaid Other | Admitting: *Deleted

## 2020-04-23 ENCOUNTER — Other Ambulatory Visit: Payer: Self-pay

## 2020-04-23 DIAGNOSIS — Z3042 Encounter for surveillance of injectable contraceptive: Secondary | ICD-10-CM

## 2020-04-23 MED ORDER — MEDROXYPROGESTERONE ACETATE 150 MG/ML IM SUSP
150.0000 mg | Freq: Once | INTRAMUSCULAR | Status: AC
  Administered 2020-04-23: 150 mg via INTRAMUSCULAR

## 2020-04-23 NOTE — Progress Notes (Signed)
   NURSE VISIT- INJECTION  SUBJECTIVE:  Kendra Schneider is a 35 y.o. G0P0 female here for a Depo Provera for contraception/period management. She is a GYN patient.   OBJECTIVE:  There were no vitals taken for this visit.  Appears well, in no apparent distress  Injection administered in: Left deltoid  No orders of the defined types were placed in this encounter.   ASSESSMENT: GYN patient Depo Provera for contraception/period management PLAN: Follow-up: in 11-13 weeks for next Depo   [name]  04/23/2020 2:42 PM

## 2020-06-04 ENCOUNTER — Other Ambulatory Visit: Payer: Medicaid Other | Admitting: Adult Health

## 2020-06-18 ENCOUNTER — Other Ambulatory Visit: Payer: Medicaid Other | Admitting: Adult Health

## 2020-06-26 ENCOUNTER — Ambulatory Visit (INDEPENDENT_AMBULATORY_CARE_PROVIDER_SITE_OTHER): Payer: Medicaid Other | Admitting: Adult Health

## 2020-06-26 ENCOUNTER — Other Ambulatory Visit (HOSPITAL_COMMUNITY)
Admission: RE | Admit: 2020-06-26 | Discharge: 2020-06-26 | Disposition: A | Discharge status: Home / Self Care | Payer: Medicaid Other | Source: Ambulatory Visit | Attending: Adult Health | Admitting: Adult Health

## 2020-06-26 ENCOUNTER — Other Ambulatory Visit: Payer: Self-pay

## 2020-06-26 ENCOUNTER — Encounter: Payer: Self-pay | Admitting: Adult Health

## 2020-06-26 VITALS — BP 106/80 | HR 124 | Ht 61.0 in | Wt 235.4 lb

## 2020-06-26 DIAGNOSIS — Z01419 Encounter for gynecological examination (general) (routine) without abnormal findings: Secondary | ICD-10-CM | POA: Diagnosis not present

## 2020-06-26 DIAGNOSIS — R102 Pelvic and perineal pain: Secondary | ICD-10-CM

## 2020-06-26 DIAGNOSIS — Z7689 Persons encountering health services in other specified circumstances: Secondary | ICD-10-CM | POA: Diagnosis not present

## 2020-06-26 DIAGNOSIS — Z3042 Encounter for surveillance of injectable contraceptive: Secondary | ICD-10-CM | POA: Insufficient documentation

## 2020-06-26 MED ORDER — MEDROXYPROGESTERONE ACETATE 150 MG/ML IM SUSY: PREFILLED_SYRINGE | INTRAMUSCULAR | 4 refills | Status: DC
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
## 2020-06-29 LAB — CYTOLOGY - PAP
Chlamydia: NEGATIVE
Comment: NEGATIVE
Comment: NEGATIVE
Comment: NORMAL
Diagnosis: NEGATIVE
High risk HPV: NEGATIVE
Neisseria Gonorrhea: NEGATIVE

## 2020-07-09 ENCOUNTER — Ambulatory Visit: Payer: Medicaid Other

## 2020-07-10 ENCOUNTER — Ambulatory Visit (INDEPENDENT_AMBULATORY_CARE_PROVIDER_SITE_OTHER): Payer: Medicaid Other

## 2020-07-10 ENCOUNTER — Other Ambulatory Visit: Payer: Self-pay

## 2020-07-10 DIAGNOSIS — Z3042 Encounter for surveillance of injectable contraceptive: Secondary | ICD-10-CM | POA: Diagnosis not present

## 2020-07-10 MED ORDER — MEDROXYPROGESTERONE ACETATE 150 MG/ML IM SUSY: PREFILLED_SYRINGE | Freq: Once | INTRAMUSCULAR | Status: AC

## 2020-07-10 NOTE — Progress Notes (Signed)
   NURSE VISIT- INJECTION  SUBJECTIVE:  Kendra Schneider is a 35 y.o. G0P0 female here for a Depo Provera for contraception/period management. She is a GYN patient.   OBJECTIVE:  LMP  (LMP Unknown)   Appears well, in no apparent distress  Injection administered in: Right deltoid  Meds ordered this encounter  Medications   medroxyPROGESTERone Acetate SUSY    ASSESSMENT: GYN patient Depo Provera for contraception/period management PLAN: Follow-up: in 11-13 weeks for next Depo   Amaris Garrette A Khyrie Masi  07/10/2020 3:16 PM

## 2020-07-19 ENCOUNTER — Other Ambulatory Visit: Payer: Medicaid Other

## 2020-08-10 ENCOUNTER — Ambulatory Visit (INDEPENDENT_AMBULATORY_CARE_PROVIDER_SITE_OTHER): Payer: Medicaid Other

## 2020-08-10 ENCOUNTER — Other Ambulatory Visit: Payer: Self-pay

## 2020-08-10 DIAGNOSIS — R102 Pelvic and perineal pain: Secondary | ICD-10-CM | POA: Diagnosis not present

## 2020-08-10 NOTE — Progress Notes (Addendum)
PELVIC US transabdominal only per Jennifer,atrophic homogeneous anteverted uterus,wnl,EEC 4.3 mm,normal ovaries,large palpable mass LLQ,? Hernia LLQ,peristalsing bowel (image 109) within mass,no free fluid

## 2020-08-13 ENCOUNTER — Other Ambulatory Visit: Payer: Self-pay | Admitting: Women's Health

## 2020-08-13 ENCOUNTER — Telehealth: Payer: Self-pay

## 2020-08-13 DIAGNOSIS — M958 Other specified acquired deformities of musculoskeletal system: Secondary | ICD-10-CM

## 2020-08-13 DIAGNOSIS — R1032 Left lower quadrant pain: Secondary | ICD-10-CM

## 2020-08-13 NOTE — Telephone Encounter (Signed)
Called pt to inform her of [place] results. Asked to speak to aunt due to pt's decreased mentation. Pt's aunt confirms understanding.

## 2020-08-13 NOTE — Telephone Encounter (Signed)
-----   Message from Roma Schanz, [place] sent at 08/13/2020 11:44 AM EDT ----- Will you let her know pelvic u/s was normal, Dr. Elonda Husky thinks the area in LLQ is abdominal wall defect (like a hernia). Will put in referral to Dr. Constance Haw here in [place]. Thanks

## 2020-10-05 ENCOUNTER — Other Ambulatory Visit: Payer: Self-pay

## 2020-10-05 ENCOUNTER — Ambulatory Visit (INDEPENDENT_AMBULATORY_CARE_PROVIDER_SITE_OTHER): Payer: Medicaid Other | Admitting: *Deleted

## 2020-10-05 DIAGNOSIS — Z308 Encounter for other contraceptive management: Secondary | ICD-10-CM

## 2020-10-05 MED ORDER — MEDROXYPROGESTERONE ACETATE 150 MG/ML IM SUSP
150.0000 mg | Freq: Once | INTRAMUSCULAR | Status: AC
  Administered 2020-10-05: 150 mg via INTRAMUSCULAR

## 2020-10-05 NOTE — Progress Notes (Signed)
   NURSE VISIT- INJECTION  SUBJECTIVE:  Kendra Schneider is a 35 y.o. G0P0 female here for a Depo Provera for contraception/period management. She is a GYN patient.   OBJECTIVE:  There were no vitals taken for this visit.  Appears well, in no apparent distress  Injection administered in: Left deltoid  Meds ordered this encounter  Medications   medroxyPROGESTERone (DEPO-PROVERA) injection 150 mg    ASSESSMENT: GYN patient Depo Provera for contraception/period management PLAN: Follow-up: in 11-13 weeks for next Depo   Levy Pupa  10/05/2020 11:48 AM

## 2020-12-25 ENCOUNTER — Ambulatory Visit (INDEPENDENT_AMBULATORY_CARE_PROVIDER_SITE_OTHER): Payer: Medicaid Other

## 2020-12-25 ENCOUNTER — Other Ambulatory Visit: Payer: Self-pay

## 2020-12-25 DIAGNOSIS — Z3042 Encounter for surveillance of injectable contraceptive: Secondary | ICD-10-CM

## 2020-12-25 MED ORDER — MEDROXYPROGESTERONE ACETATE 150 MG/ML IM SUSY: PREFILLED_SYRINGE | Freq: Once | INTRAMUSCULAR | Status: AC

## 2020-12-25 NOTE — Progress Notes (Signed)
   NURSE VISIT- INJECTION  SUBJECTIVE:  Kendra Schneider is a 35 y.o. G0P0 female here for a Depo Provera for contraception/period management. She is a GYN patient.   OBJECTIVE:  There were no vitals taken for this visit.  Appears well, in no apparent distress  Injection administered in: Right deltoid  Meds ordered this encounter  Medications   medroxyPROGESTERone Acetate SUSY    ASSESSMENT: GYN patient Depo Provera for contraception/period management PLAN: Follow-up: in 11-13 weeks for next Depo   Navaya Wiatrek A Caira Poche  12/25/2020 2:33 PM

## 2021-03-19 ENCOUNTER — Ambulatory Visit: Payer: Medicaid Other

## 2021-03-22 ENCOUNTER — Ambulatory Visit (INDEPENDENT_AMBULATORY_CARE_PROVIDER_SITE_OTHER): Payer: Medicaid Other

## 2021-03-22 ENCOUNTER — Other Ambulatory Visit: Payer: Self-pay

## 2021-03-22 DIAGNOSIS — Z30013 Encounter for initial prescription of injectable contraceptive: Secondary | ICD-10-CM | POA: Diagnosis not present

## 2021-03-22 DIAGNOSIS — Z3042 Encounter for surveillance of injectable contraceptive: Secondary | ICD-10-CM

## 2021-03-22 MED ORDER — MEDROXYPROGESTERONE ACETATE 150 MG/ML IM SUSY: PREFILLED_SYRINGE | Freq: Once | INTRAMUSCULAR | Status: AC

## 2021-03-22 MED ORDER — MEDROXYPROGESTERONE ACETATE 150 MG/ML IM SUSY: PREFILLED_SYRINGE | INTRAMUSCULAR | 4 refills | Status: DC

## 2021-03-22 NOTE — Progress Notes (Signed)
° °  NURSE VISIT- INJECTION  SUBJECTIVE:  Kendra Schneider is a 36 y.o. G0P0 female here for a Depo Provera for contraception/period management. She is a GYN patient.   OBJECTIVE:  There were no vitals taken for this visit.  Appears well, in no apparent distress  Injection administered in: Left deltoid  Meds ordered this encounter  Medications   medroxyPROGESTERone Acetate SUSY    ASSESSMENT: GYN patient Depo Provera for contraception/period management PLAN: Follow-up: in 11-13 weeks for next Depo   Tiersa Dayley A Pierra Skora  03/22/2021 10:37 AM

## 2021-06-14 ENCOUNTER — Ambulatory Visit: Payer: Medicaid Other

## 2021-06-17 ENCOUNTER — Ambulatory Visit (INDEPENDENT_AMBULATORY_CARE_PROVIDER_SITE_OTHER): Payer: Medicaid Other

## 2021-06-17 DIAGNOSIS — Z3042 Encounter for surveillance of injectable contraceptive: Secondary | ICD-10-CM

## 2021-06-17 MED ORDER — MEDROXYPROGESTERONE ACETATE 150 MG/ML IM SUSY: PREFILLED_SYRINGE | Freq: Once | INTRAMUSCULAR | Status: AC

## 2021-06-17 NOTE — Progress Notes (Signed)
   NURSE VISIT- INJECTION  SUBJECTIVE:  Kendra Schneider is a 36 y.o. G0P0 female here for a Depo Provera for contraception/period management. She is a GYN patient.   OBJECTIVE:  There were no vitals taken for this visit.  Appears well, in no apparent distress  Injection administered in: Right deltoid  Meds ordered this encounter  Medications   medroxyPROGESTERone Acetate SUSY    ASSESSMENT: GYN patient Depo Provera for contraception/period management PLAN: Follow-up: in 11-13 weeks for next Depo   Kessler Solly A Orpha Dain  06/17/2021 2:22 PM

## 2021-07-31 ENCOUNTER — Other Ambulatory Visit: Payer: Self-pay | Admitting: Neurological Surgery

## 2021-07-31 ENCOUNTER — Other Ambulatory Visit (HOSPITAL_COMMUNITY): Payer: Self-pay | Admitting: Neurological Surgery

## 2021-07-31 DIAGNOSIS — D496 Neoplasm of unspecified behavior of brain: Secondary | ICD-10-CM

## 2021-09-04 IMAGING — MR MR HEAD WO/W CM
7 of 13 series · 26 of 48 positions shown · IV contrast (gadavist)
Comparison: 03/24/2019

CLINICAL DATA: Meningioma post resection

EXAM:
MRI HEAD WITHOUT AND WITH CONTRAST
TECHNIQUE: Multiplanar, multiecho pulse sequences of the brain and surrounding
structures were obtained without and with intravenous contrast.
CONTRAST:  10mL GADAVIST GADOBUTROL 1 MMOL/ML IV SOLN

[Series 3: DWI · axial · 3.0mm · 0.94mm/px · z∈[-115,+32]mm · 8 of 100 slices shown (1 of 2)]
[im 1/100]
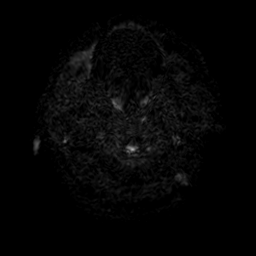
[im 15/100]
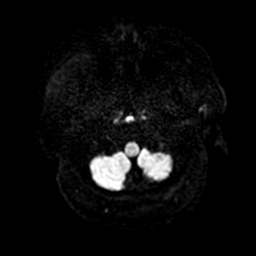
[im 29/100]
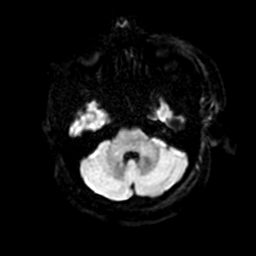
[im 43/100]
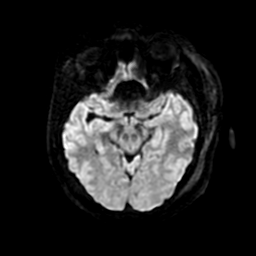
[im 57/100]
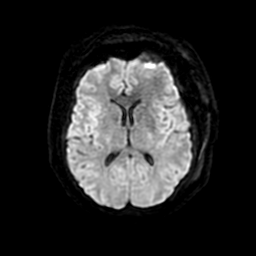
[im 71/100]
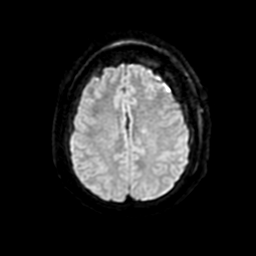
[im 85/100]
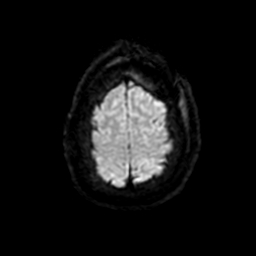
[im 100/100]
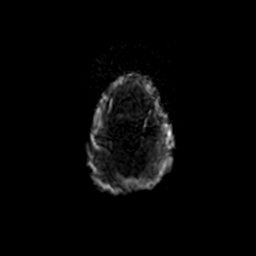

[Series 4: DWI · coronal · 4.0mm · 0.94mm/px · 5 of 68 slices shown (2 of 2)]
[im 1/68]
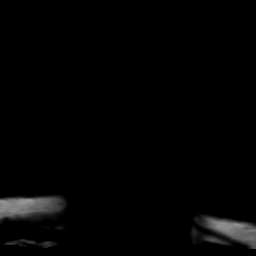
[im 17/68]
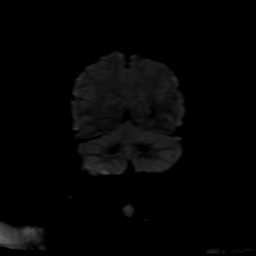
[im 34/68]
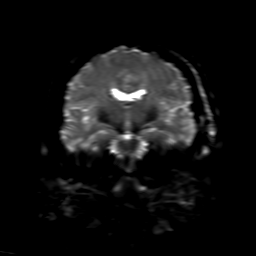
[im 51/68]
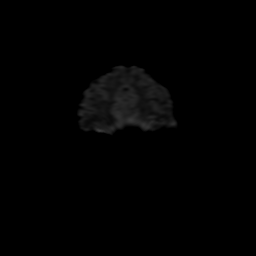
[im 68/68]
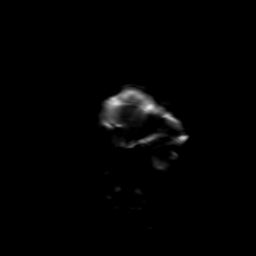

[Series 5: FLAIR · sagittal · 5.0mm · 0.23mm/px · 2 of 25 slices shown (1 of 2)]
[im 1/25]
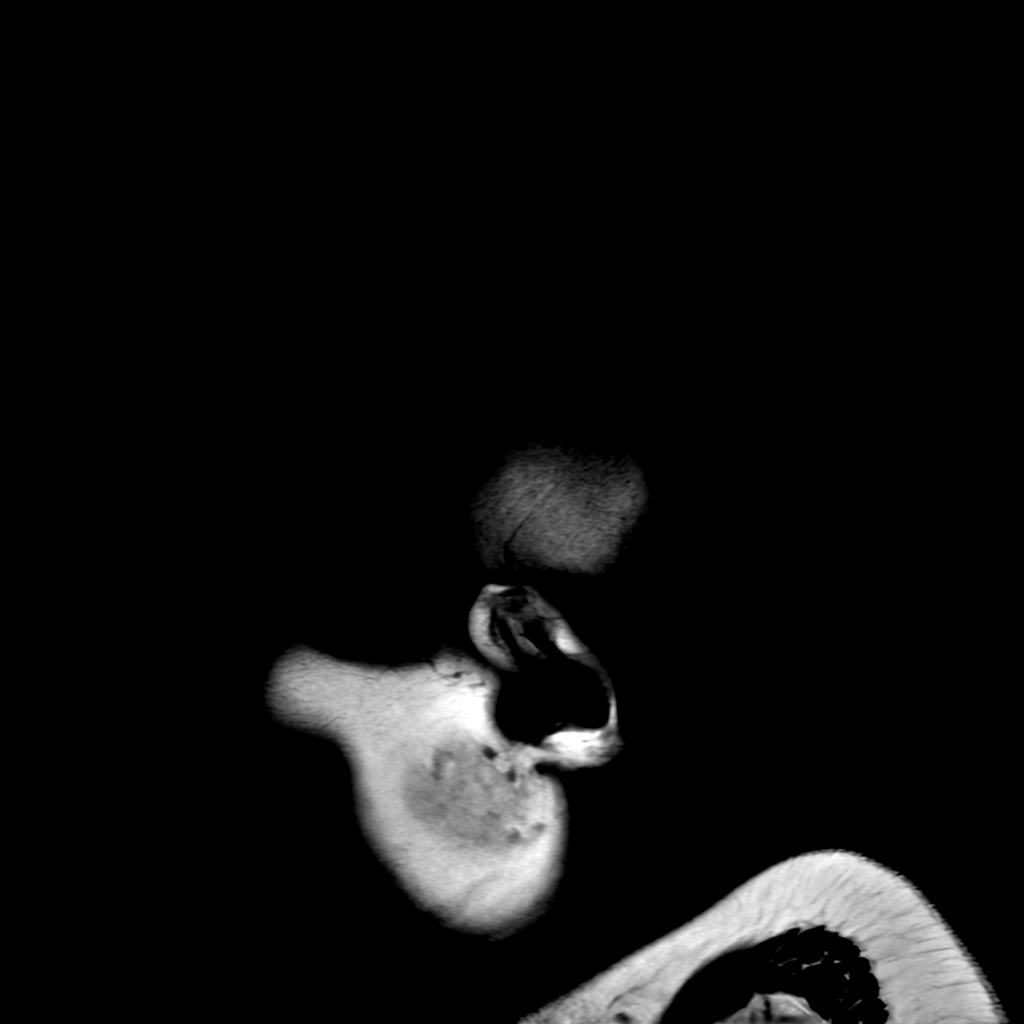
[im 25/25]
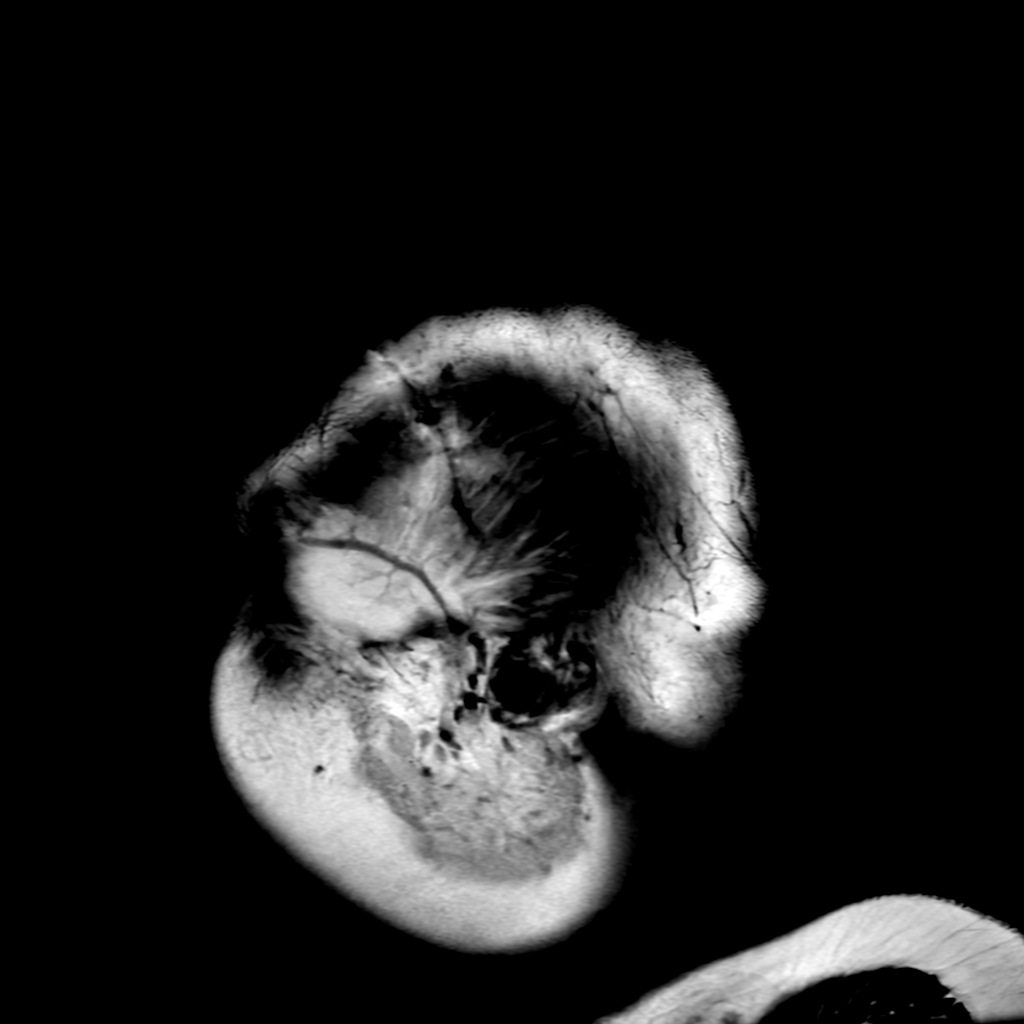

[Series 7: FLAIR · axial · 3.0mm · 0.47mm/px · z∈[-108,+41]mm · 2 of 26 slices shown (2 of 2)]
[im 1/26]
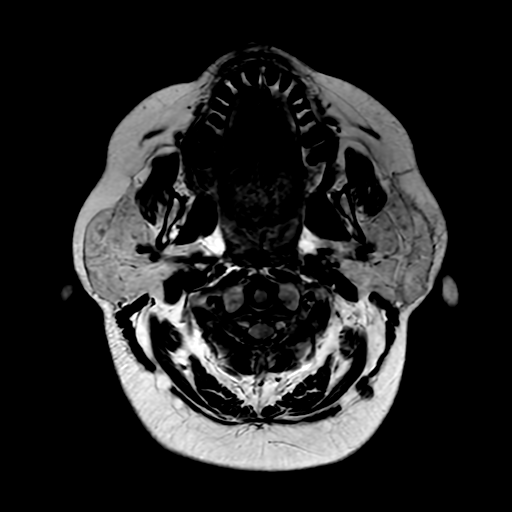
[im 26/26]
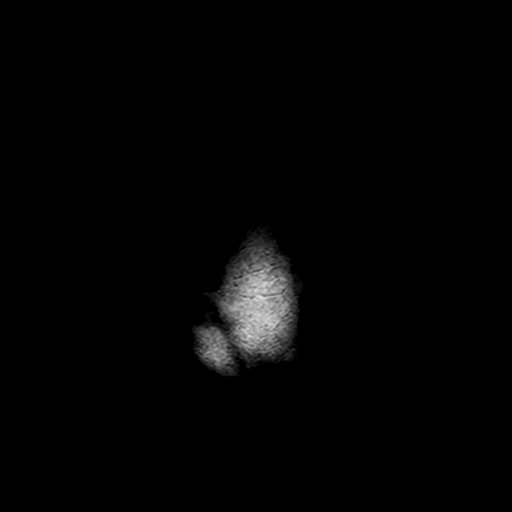

[Series 13: FLAIR post-contrast · sagittal · 5.0mm · 0.47mm/px · 2 of 25 slices shown]
[im 1/25]
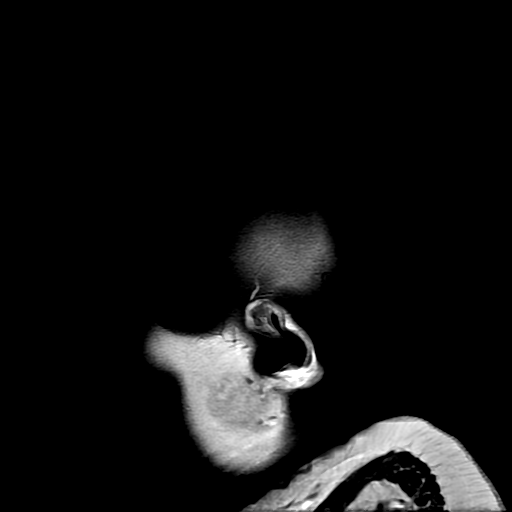
[im 25/25]
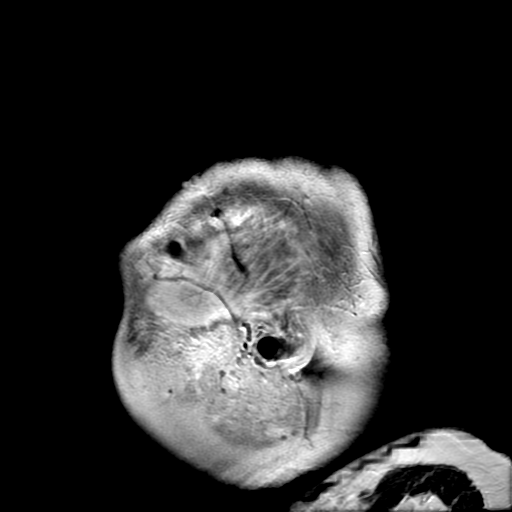

[Series 350: ADC · axial · 3.0mm · 0.94mm/px · z∈[-115,+32]mm · 4 of 49 slices shown (1 of 2)]
[im 1/49]
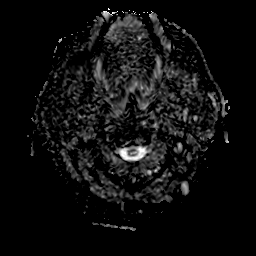
[im 17/49]
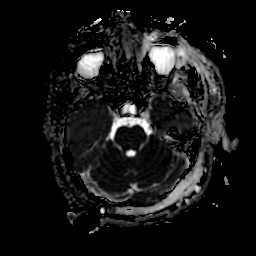
[im 33/49]
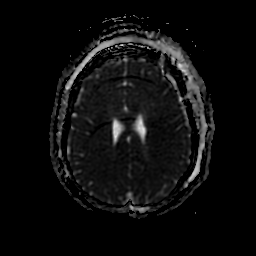
[im 49/49]
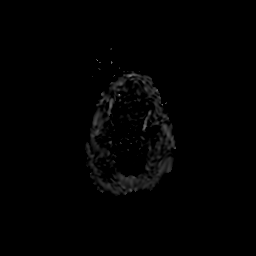

[Series 450: ADC · coronal · 4.0mm · 0.94mm/px · 3 of 34 slices shown (2 of 2)]
[im 1/34]
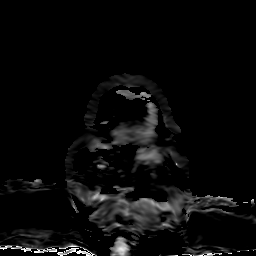
[im 17/34]
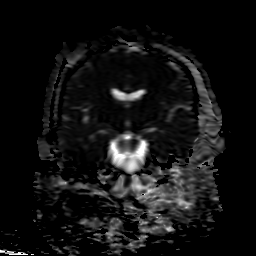
[im 34/34]
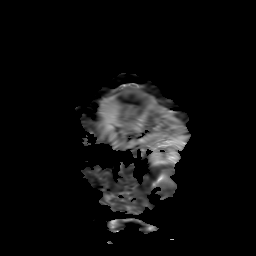

[26 of 48 positions shown; findings below may reference images not displayed]

FINDINGS: Brain: Postoperative changes are present including a thin
extra-axial collection underlying craniotomy. There is a left
frontal resection cavity containing fluid and blood products. Mild
edema in the adjacent parenchyma. No nodular enhancement to suggest
residual tumor.

There is an additional 8 x 4 mm meningioma along the lateral left
temporal convexity (series 11, image 24). Additional 4 x 4 mm
meningioma arising from the left anterior clinoid process (series
11, image 23). These lesions are present on the prior study and
stable in retrospect. No associated parenchymal edema.

Few small foci of T2 hyperintensity are again identified in the
periventricular white matter.

Vascular: Major vessel flow voids at the skull base are preserved.

Skull and upper cervical spine: Post left craniotomy. Normal marrow
signal is preserved.

Sinuses/Orbits: Paranasal sinuses are aerated. Orbits are
unremarkable.

Other: Sella is unremarkable.  Mastoid air cells are clear.
IMPRESSION: Expected postoperative changes post gross total resection of left
frontal meningioma.

Two additional small meningiomas are present.

Few small foci of T2 hyperintensity in the cerebral white matter.
While nonspecific, the distribution raises the possibility of
primary demyelinating disease.

## 2021-09-06 ENCOUNTER — Ambulatory Visit (HOSPITAL_COMMUNITY)
Admission: RE | Admit: 2021-09-06 | Discharge: 2021-09-06 | Disposition: A | Discharge status: Home / Self Care | Payer: Medicaid Other | Source: Ambulatory Visit | Attending: Neurological Surgery | Admitting: Neurological Surgery

## 2021-09-06 DIAGNOSIS — D496 Neoplasm of unspecified behavior of brain: Secondary | ICD-10-CM

## 2021-09-06 MED ORDER — GADOBUTROL 1 MMOL/ML IV SOLN
10.0000 mL | Freq: Once | INTRAVENOUS | Status: AC | PRN
  Administered 2021-09-06: 10 mL via INTRAVENOUS

## 2021-09-09 ENCOUNTER — Ambulatory Visit (INDEPENDENT_AMBULATORY_CARE_PROVIDER_SITE_OTHER): Payer: Medicaid Other | Admitting: *Deleted

## 2021-09-09 DIAGNOSIS — Z3042 Encounter for surveillance of injectable contraceptive: Secondary | ICD-10-CM | POA: Diagnosis not present

## 2021-09-09 MED ORDER — MEDROXYPROGESTERONE ACETATE 150 MG/ML IM SUSP
150.0000 mg | Freq: Once | INTRAMUSCULAR | Status: AC
  Administered 2021-09-09: 150 mg via INTRAMUSCULAR

## 2021-09-09 NOTE — Progress Notes (Signed)
   NURSE VISIT- INJECTION  SUBJECTIVE:  Kendra Schneider is a 36 y.o. G0P0 female here for a Depo Provera for contraception/period management. She is a GYN patient.   OBJECTIVE:  There were no vitals taken for this visit.  Appears well, in no apparent distress  Injection administered in: Left deltoid  Meds ordered this encounter  Medications   medroxyPROGESTERone (DEPO-PROVERA) injection 150 mg    ASSESSMENT: GYN patient Depo Provera for contraception/period management PLAN: Follow-up: in 11-13 weeks for next Depo   [name]  09/09/2021 2:34 PM

## 2021-11-26 ENCOUNTER — Ambulatory Visit (INDEPENDENT_AMBULATORY_CARE_PROVIDER_SITE_OTHER): Payer: Medicaid Other | Admitting: *Deleted

## 2021-11-26 DIAGNOSIS — Z3042 Encounter for surveillance of injectable contraceptive: Secondary | ICD-10-CM | POA: Diagnosis not present

## 2021-11-26 MED ORDER — MEDROXYPROGESTERONE ACETATE 150 MG/ML IM SUSP
150.0000 mg | Freq: Once | INTRAMUSCULAR | Status: AC
  Administered 2021-11-26: 150 mg via INTRAMUSCULAR

## 2021-11-26 NOTE — Progress Notes (Signed)
   NURSE VISIT- INJECTION  SUBJECTIVE:  Kendra Schneider is a 36 y.o. G0P0 female here for a Depo Provera for contraception/period management. She is a GYN patient.   OBJECTIVE:  There were no vitals taken for this visit.  Appears well, in no apparent distress  Injection administered in: Right deltoid  Meds ordered this encounter  Medications   medroxyPROGESTERone (DEPO-PROVERA) injection 150 mg    ASSESSMENT: GYN patient Depo Provera for contraception/period management PLAN: Follow-up: in 11-13 weeks for next Depo   Kendra Schneider  11/26/2021 2:33 PM

## 2022-02-11 ENCOUNTER — Ambulatory Visit (INDEPENDENT_AMBULATORY_CARE_PROVIDER_SITE_OTHER): Payer: Medicaid Other | Admitting: *Deleted

## 2022-02-11 DIAGNOSIS — Z3042 Encounter for surveillance of injectable contraceptive: Secondary | ICD-10-CM

## 2022-02-11 MED ORDER — MEDROXYPROGESTERONE ACETATE 150 MG/ML IM SUSP
150.0000 mg | Freq: Once | INTRAMUSCULAR | Status: AC
  Administered 2022-02-11: 150 mg via INTRAMUSCULAR

## 2022-02-11 NOTE — Progress Notes (Signed)
   NURSE VISIT- INJECTION  SUBJECTIVE:  Kendra Schneider is a 36 y.o. G0P0 female here for a Depo Provera for contraception/period management. She is a GYN patient.   OBJECTIVE:  There were no vitals taken for this visit.  Appears well, in no apparent distress  Injection administered in: Left deltoid  Meds ordered this encounter  Medications   medroxyPROGESTERone (DEPO-PROVERA) injection 150 mg    ASSESSMENT: GYN patient Depo Provera for contraception/period management PLAN: Follow-up: in 11-13 weeks for next Depo   [name]  02/11/2022 3:05 PM

## 2022-04-28 ENCOUNTER — Other Ambulatory Visit: Payer: Self-pay | Admitting: Adult Health

## 2022-05-06 ENCOUNTER — Ambulatory Visit (INDEPENDENT_AMBULATORY_CARE_PROVIDER_SITE_OTHER): Payer: Medicaid Other | Admitting: *Deleted

## 2022-05-06 DIAGNOSIS — Z3042 Encounter for surveillance of injectable contraceptive: Secondary | ICD-10-CM | POA: Diagnosis not present

## 2022-05-06 MED ORDER — MEDROXYPROGESTERONE ACETATE 150 MG/ML IM SUSY
150.0000 mg | PREFILLED_SYRINGE | Freq: Once | INTRAMUSCULAR | Status: AC
  Administered 2022-05-06: 150 mg via INTRAMUSCULAR

## 2022-05-06 NOTE — Progress Notes (Signed)
   NURSE VISIT- INJECTION  SUBJECTIVE:  Kendra Schneider is a 37 y.o. G0P0 female here for a Depo Provera for contraception/period management. She is a GYN patient.   OBJECTIVE:  There were no vitals taken for this visit.  Appears well, in no apparent distress  Injection administered in: Right deltoid  Meds ordered this encounter  Medications   medroxyPROGESTERone Acetate SUSY 150 mg    ASSESSMENT: GYN patient Depo Provera for contraception/period management PLAN: Follow-up: in 11-13 weeks for next Depo   Annamarie Dawley  05/06/2022 2:27 PM

## 2022-07-29 ENCOUNTER — Ambulatory Visit (INDEPENDENT_AMBULATORY_CARE_PROVIDER_SITE_OTHER): Payer: Medicaid Other | Admitting: *Deleted

## 2022-07-29 DIAGNOSIS — Z3042 Encounter for surveillance of injectable contraceptive: Secondary | ICD-10-CM

## 2022-07-29 MED ORDER — MEDROXYPROGESTERONE ACETATE 150 MG/ML IM SUSY
150.0000 mg | PREFILLED_SYRINGE | Freq: Once | INTRAMUSCULAR | Status: AC
  Administered 2022-07-29: 150 mg via INTRAMUSCULAR

## 2022-07-29 NOTE — Progress Notes (Signed)
   NURSE VISIT- INJECTION  SUBJECTIVE:  Kendra Schneider is a 37 y.o. G0P0 female here for a Depo Provera for contraception/period management. She is a GYN patient.   OBJECTIVE:  There were no vitals taken for this visit.  Appears well, in no apparent distress  Injection administered in: Left deltoid  Meds ordered this encounter  Medications   medroxyPROGESTERone Acetate SUSY 150 mg    ASSESSMENT: GYN patient Depo Provera for contraception/period management PLAN: Follow-up: in 11-13 weeks for next Depo   Jobe Marker  07/29/2022 2:37 PM

## 2022-10-21 ENCOUNTER — Ambulatory Visit (INDEPENDENT_AMBULATORY_CARE_PROVIDER_SITE_OTHER): Payer: Medicaid Other | Admitting: *Deleted

## 2022-10-21 DIAGNOSIS — Z3042 Encounter for surveillance of injectable contraceptive: Secondary | ICD-10-CM

## 2022-10-21 MED ORDER — MEDROXYPROGESTERONE ACETATE 150 MG/ML IM SUSY
150.0000 mg | PREFILLED_SYRINGE | Freq: Once | INTRAMUSCULAR | Status: AC
  Administered 2022-10-21: 150 mg via INTRAMUSCULAR

## 2022-10-21 NOTE — Progress Notes (Signed)
   NURSE VISIT- INJECTION  SUBJECTIVE:  Kendra Schneider is a 37 y.o. G0P0 female here for a Depo Provera for contraception/period management. She is a GYN patient.   OBJECTIVE:  There were no vitals taken for this visit.  Appears well, in no apparent distress  Injection administered in: Right deltoid  Meds ordered this encounter  Medications   medroxyPROGESTERone Acetate SUSY 150 mg    ASSESSMENT: GYN patient Depo Provera for contraception/period management PLAN: Follow-up: in 11-13 weeks for next Depo   Jobe Marker  10/21/2022 2:30 PM

## 2023-01-14 ENCOUNTER — Ambulatory Visit (INDEPENDENT_AMBULATORY_CARE_PROVIDER_SITE_OTHER): Payer: Medicaid Other | Admitting: *Deleted

## 2023-01-14 DIAGNOSIS — Z3042 Encounter for surveillance of injectable contraceptive: Secondary | ICD-10-CM | POA: Diagnosis not present

## 2023-01-14 MED ORDER — MEDROXYPROGESTERONE ACETATE 150 MG/ML IM SUSY
150.0000 mg | PREFILLED_SYRINGE | Freq: Once | INTRAMUSCULAR | Status: AC
  Administered 2023-01-14: 150 mg via INTRAMUSCULAR

## 2023-01-14 NOTE — Progress Notes (Signed)
   NURSE VISIT- INJECTION  SUBJECTIVE:  Kendra Schneider is a 37 y.o. G0P0 female here for a Depo Provera for contraception/period management. She is a GYN patient.   OBJECTIVE:  There were no vitals taken for this visit.  Appears well, in no apparent distress  Injection administered in: Left deltoid  Meds ordered this encounter  Medications   medroxyPROGESTERone Acetate SUSY 150 mg    ASSESSMENT: GYN patient Depo Provera for contraception/period management PLAN: Follow-up: in 11-13 weeks for next Depo   Jobe Marker  01/14/2023 3:11 PM

## 2023-04-08 ENCOUNTER — Other Ambulatory Visit: Payer: Self-pay | Admitting: *Deleted

## 2023-04-08 ENCOUNTER — Ambulatory Visit: Payer: Medicaid Other | Admitting: *Deleted

## 2023-04-08 DIAGNOSIS — Z3042 Encounter for surveillance of injectable contraceptive: Secondary | ICD-10-CM | POA: Diagnosis not present

## 2023-04-08 MED ORDER — MEDROXYPROGESTERONE ACETATE 150 MG/ML IM SUSY
150.0000 mg | PREFILLED_SYRINGE | Freq: Once | INTRAMUSCULAR | Status: AC
  Administered 2023-04-08: 150 mg via INTRAMUSCULAR

## 2023-04-08 MED ORDER — MEDROXYPROGESTERONE ACETATE 150 MG/ML IM SUSY: PREFILLED_SYRINGE | INTRAMUSCULAR | 4 refills | Status: DC

## 2023-04-08 NOTE — Progress Notes (Signed)
   NURSE VISIT- INJECTION  SUBJECTIVE:  Kendra Schneider is a 38 y.o. G0P0 female here for a Depo Provera for contraception/period management. She is a GYN patient.   OBJECTIVE:  There were no vitals taken for this visit.  Appears well, in no apparent distress  Injection administered in: Right deltoid  Meds ordered this encounter  Medications   medroxyPROGESTERone Acetate SUSY 150 mg    ASSESSMENT: GYN patient Depo Provera for contraception/period management PLAN: Follow-up: in 11-13 weeks for next Depo   Annamarie Dawley  04/08/2023 2:35 PM

## 2023-07-01 ENCOUNTER — Ambulatory Visit: Admitting: *Deleted

## 2023-07-01 DIAGNOSIS — Z3042 Encounter for surveillance of injectable contraceptive: Secondary | ICD-10-CM | POA: Diagnosis not present

## 2023-07-01 MED ORDER — MEDROXYPROGESTERONE ACETATE 150 MG/ML IM SUSY
150.0000 mg | PREFILLED_SYRINGE | Freq: Once | INTRAMUSCULAR | Status: AC
  Administered 2023-07-01: 150 mg via INTRAMUSCULAR

## 2023-07-01 NOTE — Progress Notes (Signed)
   NURSE VISIT- INJECTION  SUBJECTIVE:  Kendra Schneider is a 38 y.o. G0P0 female here for a Depo Provera  for contraception/period management. She is a GYN patient.   OBJECTIVE:  There were no vitals taken for this visit.  Appears well, in no apparent distress  Injection administered in: Left deltoid  Meds ordered this encounter  Medications   medroxyPROGESTERone  Acetate SUSY 150 mg    ASSESSMENT: GYN patient Depo Provera  for contraception/period management PLAN: Follow-up: in 11-13 weeks for next Depo   Alphonso Aschoff  07/01/2023 3:03 PM

## 2023-09-22 ENCOUNTER — Other Ambulatory Visit (HOSPITAL_COMMUNITY)
Admission: RE | Admit: 2023-09-22 | Discharge: 2023-09-22 | Disposition: A | Discharge status: Home / Self Care | Source: Ambulatory Visit | Attending: Adult Health | Admitting: Adult Health

## 2023-09-22 ENCOUNTER — Ambulatory Visit: Admitting: Adult Health

## 2023-09-22 ENCOUNTER — Ambulatory Visit

## 2023-09-22 ENCOUNTER — Encounter: Payer: Self-pay | Admitting: Adult Health

## 2023-09-22 VITALS — BP 116/83 | HR 106 | Ht 60.0 in | Wt 225.0 lb

## 2023-09-22 DIAGNOSIS — Z0000 Encounter for general adult medical examination without abnormal findings: Secondary | ICD-10-CM | POA: Insufficient documentation

## 2023-09-22 DIAGNOSIS — Z1231 Encounter for screening mammogram for malignant neoplasm of breast: Secondary | ICD-10-CM

## 2023-09-22 DIAGNOSIS — Z86011 Personal history of benign neoplasm of the brain: Secondary | ICD-10-CM

## 2023-09-22 DIAGNOSIS — Z01419 Encounter for gynecological examination (general) (routine) without abnormal findings: Secondary | ICD-10-CM

## 2023-09-22 DIAGNOSIS — Z62819 Personal history of unspecified abuse in childhood: Secondary | ICD-10-CM | POA: Diagnosis not present

## 2023-09-22 DIAGNOSIS — Z7689 Persons encountering health services in other specified circumstances: Secondary | ICD-10-CM

## 2023-09-22 DIAGNOSIS — R300 Dysuria: Secondary | ICD-10-CM

## 2023-09-22 LAB — POCT URINALYSIS DIPSTICK
Glucose, UA: NEGATIVE
Ketones, UA: NEGATIVE
Leukocytes, UA: NEGATIVE
Nitrite, UA: NEGATIVE
Protein, UA: NEGATIVE

## 2023-09-22 MED ORDER — NORETHINDRONE 0.35 MG PO TABS: 1.0000 | ORAL_TABLET | Freq: Every day | ORAL | 3 refills | Status: DC
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
## 2023-09-23 ENCOUNTER — Telehealth: Payer: Self-pay

## 2023-09-23 LAB — URINALYSIS, ROUTINE W REFLEX MICROSCOPIC
Bilirubin, UA: NEGATIVE
Glucose, UA: NEGATIVE
Ketones, UA: NEGATIVE
Leukocytes,UA: NEGATIVE
Nitrite, UA: NEGATIVE
RBC, UA: NEGATIVE
Specific Gravity, UA: 1.028 (ref 1.005–1.030)
Urobilinogen, Ur: 0.2 mg/dL (ref 0.2–1.0)
pH, UA: 6 (ref 5.0–7.5)

## 2023-09-23 NOTE — Telephone Encounter (Signed)
 Patient wants Delon to call her; would not say what it is concerning.

## 2023-09-23 NOTE — Telephone Encounter (Signed)
 Kendra Schneider called and had hives after taking Micronor ,do not take any more, she wants appointment with Dr Jayne to talk surgery

## 2023-09-24 ENCOUNTER — Ambulatory Visit: Payer: Self-pay | Admitting: Adult Health

## 2023-09-24 LAB — URINE CULTURE

## 2023-09-24 NOTE — Telephone Encounter (Signed)
-----   Message from Morgan sent at 09/24/2023  1:51 PM EDT ----- Let her know no dominant on urine

## 2023-09-24 NOTE — Telephone Encounter (Signed)
 Pt aware no dominant growth on urine culture so no need for an antibiotic. Pt voiced understanding. JSY

## 2023-09-25 LAB — CYTOLOGY - PAP
Comment: NEGATIVE
Diagnosis: NEGATIVE
High risk HPV: NEGATIVE

## 2023-09-29 NOTE — Telephone Encounter (Signed)
 Left message @ 1:24 pm, letting pt know pap was negative for HPV and malignancy. Next pap due in 3 years. JSY

## 2023-09-29 NOTE — Telephone Encounter (Signed)
-----   Message from Delon Lewis sent at 09/29/2023 12:58 PM EDT ----- Please let her know about pap THX

## 2023-09-30 ENCOUNTER — Ambulatory Visit (HOSPITAL_COMMUNITY)
Admission: RE | Admit: 2023-09-30 | Discharge: 2023-09-30 | Disposition: A | Discharge status: Home / Self Care | Source: Ambulatory Visit | Attending: Adult Health | Admitting: Adult Health

## 2023-09-30 ENCOUNTER — Encounter (HOSPITAL_COMMUNITY): Payer: Self-pay

## 2023-09-30 DIAGNOSIS — Z1231 Encounter for screening mammogram for malignant neoplasm of breast: Secondary | ICD-10-CM | POA: Insufficient documentation

## 2023-10-02 ENCOUNTER — Ambulatory Visit: Admitting: Obstetrics & Gynecology

## 2023-10-02 ENCOUNTER — Encounter: Payer: Self-pay | Admitting: Obstetrics & Gynecology

## 2023-10-02 VITALS — BP 113/83 | HR 100 | Ht 60.0 in | Wt 223.0 lb

## 2023-10-02 DIAGNOSIS — N946 Dysmenorrhea, unspecified: Secondary | ICD-10-CM

## 2023-10-02 DIAGNOSIS — Z86011 Personal history of benign neoplasm of the brain: Secondary | ICD-10-CM | POA: Diagnosis not present

## 2023-10-02 DIAGNOSIS — N920 Excessive and frequent menstruation with regular cycle: Secondary | ICD-10-CM
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
## 2023-10-06 NOTE — Telephone Encounter (Signed)
-----   Message from [place] sent at 10/06/2023  8:29 AM EDT ----- Please let her know mammogram was negative, repeat in 1 year THX

## 2023-10-06 NOTE — Telephone Encounter (Signed)
 Left message @ 8:34 am, letting pt know mammogram was negative and we recommend repeat in 1 year. Advised to call with any questions. JSY

## 2023-10-15 ENCOUNTER — Encounter: Payer: Self-pay | Admitting: Obstetrics & Gynecology

## 2023-11-13 NOTE — Patient Instructions (Signed)
 Your procedure is scheduled on: 11/18/2023  Report to [facility] Main Entrance at   6:30  AM.  Call this number if you have problems the morning of surgery: [phone]   Remember:   Do not Eat after midnight, You may have CLEAR liquids until 4:30 am, water tea, soft drink, and/or BLACK coffee, NO CREAMER OR MILK  Drink carb loading drink at 4:30 AM MORNING OF PROCEDURE         No Smoking the morning of surgery  :  Take these medicines the morning of surgery with A SIP OF WATER: Omeprazole, and zoloft  Use inhalers if needed Zofran  and/or Maxalt  if needed   Do not wear jewelry, make-up or nail polish.  Do not wear lotions, powders, or perfumes. You may wear deodorant.  Do not shave 48 hours prior to surgery. Men may shave face and neck.  Do not bring valuables to the hospital.  Contacts, dentures or bridgework may not be worn into surgery.  Leave suitcase in the car. After surgery it may be brought to your room.  For patients admitted to the hospital, checkout time is 11:00 AM the day of discharge.   Patients discharged the day of surgery will not be allowed to drive home.    Special Instructions: Shower using CHG night before surgery and shower the day of surgery use CHG.  Use special wash - you have one bottle of CHG for all showers.  You should use approximately 1/2 of the bottle for each shower. How to Use Chlorhexidine  at Home in the Shower Chlorhexidine  gluconate (CHG) is a germ-killing (antiseptic) wash that's used to clean the skin. It can get rid of the germs that normally live on the skin and can keep them away for about 24 hours. If you're having surgery, you may be told to shower with CHG at home the night before surgery. This can help lower your risk for infection. To use CHG wash in the shower, follow the steps below. Supplies needed: CHG body wash. Clean washcloth. Clean towel. How to use CHG in the shower Follow these steps unless you're told to use CHG in a  different way: Start the shower. Use your normal soap and shampoo to wash your face and hair. Turn off the shower or move out of the shower stream. Pour CHG onto a clean washcloth. Do not use any type of brush or rough sponge. Start at your neck, washing your body down to your toes. Make sure you: Wash the part of your body where the surgery will be done for at least 1 minute. Do not scrub. Do not use CHG on your head or face unless your health care provider tells you to. If it gets into your ears or eyes, rinse them well with water. Do not wash your genitals with CHG. Wash your back and under your arms. Make sure to wash skin folds. Let the CHG sit on your skin for 1-2 minutes or as long as told. Rinse your entire body in the shower, including all body creases and folds. Turn off the shower. Dry off with a clean towel. Do not put anything on your skin afterward, such as powder, lotion, or perfume. Put on clean clothes or pajamas. If it's the night before surgery, sleep in clean sheets. General tips Use CHG only as told, and follow the instructions on the label. Use the full amount of CHG as told. This is often one bottle. Do not smoke and stay away  from flames after using CHG. Your skin may feel sticky after using CHG. This is normal. The sticky feeling will go away as the CHG dries. Do not use CHG: If you have a chlorhexidine  allergy or have reacted to chlorhexidine  in the past. On open wounds or areas of skin that have broken skin, cuts, or scrapes. On babies younger than 30 months of age. Contact a health care provider if: You have questions about using CHG. Your skin gets irritated or itchy. You have a rash after using CHG. You swallow any CHG. Call your local poison control center [phone] in the [place] Your eyes itch badly, or they become very red or swollen. Your hearing changes. You have trouble seeing. If you can't reach your provider, go to an urgent care or emergency  room. Do not drive yourself. Get help right away if: You have swelling or tingling in your mouth or throat. You make high-pitched whistling sounds when you breathe, most often when you breathe out (wheeze). You have trouble breathing. These symptoms may be an emergency. Call 911 right away. Do not wait to see if the symptoms will go away. Do not drive yourself to the hospital. This information is not intended to replace advice given to you by your health care provider. Make sure you discuss any questions you have with your health care provider. Document Revised: 07/29/2022 Document Reviewed: 07/25/2021 Elsevier Patient Education  2024 Elsevier Inc.  Laparoscopically Assisted  Hysterectomy, Care After After a LAVH, it's common to have soreness and numbness in your incision areas. You'll have pain in your belly. You may also have: Bleeding and discharge from the vagina. Tiredness. Sadness and other emotions. If your ovaries were taken out, you may also have symptoms of menopause, such as hot flashes, night sweats, and lack of sleep. Follow these instructions at home: Medicines Take over-the-counter and prescription medicines only as told by your health care provider. If you were given antibiotics, take them as told by your provider. Do not stop taking the antibiotic even if you start to feel better. Ask your provider if the medicine prescribed to you: Requires you to avoid driving or using machinery. Can cause constipation. You may need to take these actions to prevent or treat constipation: Drink enough fluid to keep your pee (urine) pale yellow. Take over-the-counter or prescription medicines. Eat foods that are high in fiber, such as beans, whole grains, and fresh fruits and vegetables. Limit foods that are high in fat and processed sugars, such as fried or sweet foods. Incision care  Follow instructions from your provider about how to take care of your incisions. Make sure  you: Wash your hands with soap and water for at least 20 seconds before and after you change your bandage. If soap and water aren't available, use hand sanitizer. Change your dressing as told by your provider. Leave stitches, skin glue, or tape strips in place. These skin closures may need to stay in place for 2 weeks or longer. If tape strip edges start to loosen and curl up, you may trim the loose edges. Do not remove tape strips completely unless your provider tells you to do that. Check your incision areas every day for signs of infection. Check for: More redness, swelling, or pain. More fluid or blood. Warmth. Pus or a bad smell. Activity  Rest as told by your provider. Do not sit for a long time without moving. Get up to take short walks every 1-2 hours. This will improve  blood flow and breathing. Ask for help if you feel weak or unsteady. You may have to avoid lifting. Ask your provider how much you can safely lift. Return to your normal activities as told by your provider. Ask your provider what activities are safe for you. Lifestyle Do not use any products that contain nicotine or tobacco. These products include cigarettes, chewing tobacco, and vaping devices, such as e-cigarettes. These can delay healing after surgery. If you need help quitting, ask your provider. Do not drink alcohol until your provider approves. General instructions Do not douche, use tampons, have sex, or put anything in the vagina for at least 6 weeks. If you struggle with physical or emotional changes after your procedure, speak with your provider or a therapist. Do not take baths, swim, or use a hot tub until your provider approves. Ask your provider if you may take showers. You may only be allowed to take sponge baths. Try to have someone at home with you for the first 1-2 weeks to help with your daily chores. Wear compression stockings as told by your provider. These stockings help to prevent blood clots and  reduce swelling in your legs. Your provider may give you more instructions. Make sure you know what you can and can't do. Contact a health care provider if: You have any signs of infection. You have pain and your pain medicine doesn't help. You feel dizzy or light-headed. You have trouble peeing. You vomit or feel like you may vomit, and the symptoms do not go away. You have pus or discharge from your vagina that smells bad. Get help right away if: You have a fever and your symptoms suddenly get worse. You have very bad pain in the abdomen. You have chest pain or shortness of breath. You faint. You have pain, swelling, or redness in your leg. You have heavy bleeding in your vagina that soaks through a pad in less than 1 hour. You see blood clots in your bleeding. These symptoms may be an emergency. Get help right away. Call 911. Do not wait to see if the symptoms will go away. Do not drive yourself to the hospital. This information is not intended to replace advice given to you by your health care provider. Make sure you discuss any questions you have with your health care provider. Document Revised: 04/25/2022 Document Reviewed: 04/25/2022 Elsevier Patient Education  2024 Elsevier Inc. General Anesthesia, Adult, Care After The following information offers guidance on how to care for yourself after your procedure. Your health care provider may also give you more specific instructions. If you have problems or questions, contact your health care provider. What can I expect after the procedure? After the procedure, it is common for people to: Have pain or discomfort at the IV site. Have nausea or vomiting. Have a sore throat or hoarseness. Have trouble concentrating. Feel cold or chills. Feel weak, sleepy, or tired (fatigue). Have soreness and body aches. These can affect parts of the body that were not involved in surgery. Follow these instructions at home: For the time period you  were told by your health care provider:  Rest. Do not participate in activities where you could fall or become injured. Do not drive or use machinery. Do not drink alcohol. Do not take sleeping pills or medicines that cause drowsiness. Do not make important decisions or sign legal documents. Do not take care of children on your own. General instructions Drink enough fluid to keep your urine pale yellow.  If you have sleep apnea, surgery and certain medicines can increase your risk for breathing problems. Follow instructions from your health care provider about wearing your sleep device: Anytime you are sleeping, including during daytime naps. While taking prescription pain medicines, sleeping medicines, or medicines that make you drowsy. Return to your normal activities as told by your health care provider. Ask your health care provider what activities are safe for you. Take over-the-counter and prescription medicines only as told by your health care provider. Do not use any products that contain nicotine or tobacco. These products include cigarettes, chewing tobacco, and vaping devices, such as e-cigarettes. These can delay incision healing after surgery. If you need help quitting, ask your health care provider. Contact a health care provider if: You have nausea or vomiting that does not get better with medicine. You vomit every time you eat or drink. You have pain that does not get better with medicine. You cannot urinate or have bloody urine. You develop a skin rash. You have a fever. Get help right away if: You have trouble breathing. You have chest pain. You vomit blood. These symptoms may be an emergency. Get help right away. Call 911. Do not wait to see if the symptoms will go away. Do not drive yourself to the hospital. Summary After the procedure, it is common to have a sore throat, hoarseness, nausea, vomiting, or to feel weak, sleepy, or fatigue. For the time period you  were told by your health care provider, do not drive or use machinery. Get help right away if you have difficulty breathing, have chest pain, or vomit blood. These symptoms may be an emergency. This information is not intended to replace advice given to you by your health care provider. Make sure you discuss any questions you have with your health care provider. Document Revised: 04/12/2021 Document Reviewed: 04/12/2021 Elsevier Patient Education  2024 [employer].

## 2023-11-16 ENCOUNTER — Other Ambulatory Visit (HOSPITAL_COMMUNITY): Payer: Self-pay | Admitting: Obstetrics & Gynecology

## 2023-11-16 ENCOUNTER — Encounter (HOSPITAL_COMMUNITY): Payer: Self-pay

## 2023-11-16 ENCOUNTER — Encounter (HOSPITAL_COMMUNITY)
Admission: RE | Admit: 2023-11-16 | Discharge: 2023-11-16 | Disposition: A | Source: Ambulatory Visit | Attending: Obstetrics & Gynecology

## 2023-11-16 DIAGNOSIS — Z01818 Encounter for other preprocedural examination: Secondary | ICD-10-CM

## 2023-11-16 DIAGNOSIS — Z01812 Encounter for preprocedural laboratory examination: Secondary | ICD-10-CM | POA: Insufficient documentation

## 2023-11-16 HISTORY — DX: Other specified postprocedural states: Z98.890

## 2023-11-16 HISTORY — DX: Unspecified osteoarthritis, unspecified site: M19.90

## 2023-11-16 HISTORY — DX: Nausea with vomiting, unspecified: R11.2

## 2023-11-16 LAB — TYPE AND SCREEN
ABO/RH(D): A POS
Antibody Screen: NEGATIVE

## 2023-11-16 LAB — CBC
HCT: 40.5 % (ref 36.0–46.0)
Hemoglobin: 12.6 g/dL (ref 12.0–15.0)
MCH: 26.8 pg (ref 26.0–34.0)
MCHC: 31.1 g/dL (ref 30.0–36.0)
MCV: 86.2 fL (ref 80.0–100.0)
Platelets: 340 K/uL (ref 150–400)
RBC: 4.7 MIL/uL (ref 3.87–5.11)
RDW: 13.7 % (ref 11.5–15.5)
WBC: 9.5 K/uL (ref 4.0–10.5)
nRBC: 0 % (ref 0.0–0.2)

## 2023-11-16 LAB — COMPREHENSIVE METABOLIC PANEL WITH GFR
ALT: 13 U/L (ref 0–44)
AST: 20 U/L (ref 15–41)
Albumin: 4.5 g/dL (ref 3.5–5.0)
Alkaline Phosphatase: 65 U/L (ref 38–126)
Anion gap: 13 (ref 5–15)
BUN: 12 mg/dL (ref 6–20)
CO2: 22 mmol/L (ref 22–32)
Calcium: 9 mg/dL (ref 8.9–10.3)
Chloride: 103 mmol/L (ref 98–111)
Creatinine, Ser: 1 mg/dL (ref 0.44–1.00)
GFR, Estimated: >60 mL/min (ref >60)
Glucose, Bld: 75 mg/dL (ref 70–99)
Potassium: 3.9 mmol/L (ref 3.5–5.1)
Sodium: 138 mmol/L (ref 135–145)
Total Bilirubin: 0.3 mg/dL (ref 0.0–1.2)
Total Protein: 7.3 g/dL (ref 6.5–8.1)

## 2023-11-16 LAB — RAPID HIV SCREEN (HIV 1/2 AB+AG)
HIV 1/2 Antibodies: NONREACTIVE
HIV-1 P24 Antigen - HIV24: NONREACTIVE

## 2023-11-16 LAB — URINALYSIS, ROUTINE W REFLEX MICROSCOPIC
Bilirubin Urine: NEGATIVE
Glucose, UA: NEGATIVE mg/dL
Hgb urine dipstick: NEGATIVE
Ketones, ur: NEGATIVE mg/dL
Leukocytes,Ua: NEGATIVE
Nitrite: NEGATIVE
Protein, ur: NEGATIVE mg/dL
Specific Gravity, Urine: 1.017 (ref 1.005–1.030)
pH: 5 (ref 5.0–8.0)

## 2023-11-16 LAB — PREGNANCY, URINE: Preg Test, Ur: NEGATIVE

## 2023-11-17 NOTE — Progress Notes (Signed)
 Surgery time changed.  Called patient and informed to arrive at 0730 and drink eras beverage by 0530.  Patient voiced understanding.

## 2023-11-18 ENCOUNTER — Ambulatory Visit (HOSPITAL_COMMUNITY)
Admission: RE | Admit: 2023-11-18 | Discharge: 2023-11-18 | Disposition: A | Discharge status: Home / Self Care | Attending: Obstetrics & Gynecology | Admitting: Obstetrics & Gynecology

## 2023-11-18 ENCOUNTER — Ambulatory Visit (HOSPITAL_BASED_OUTPATIENT_CLINIC_OR_DEPARTMENT_OTHER): Payer: Self-pay | Admitting: Anesthesiology

## 2023-11-18 ENCOUNTER — Encounter (HOSPITAL_COMMUNITY): Payer: Self-pay | Admitting: Obstetrics & Gynecology

## 2023-11-18 ENCOUNTER — Encounter (HOSPITAL_COMMUNITY)
Admission: RE | Disposition: A | Discharge status: Home / Self Care | Payer: Self-pay | Source: Home / Self Care | Attending: Obstetrics & Gynecology

## 2023-11-18 ENCOUNTER — Ambulatory Visit (HOSPITAL_COMMUNITY): Payer: Self-pay | Admitting: Anesthesiology

## 2023-11-18 ENCOUNTER — Other Ambulatory Visit: Payer: Self-pay

## 2023-11-18 DIAGNOSIS — Z86011 Personal history of benign neoplasm of the brain: Secondary | ICD-10-CM | POA: Diagnosis not present

## 2023-11-18 DIAGNOSIS — F419 Anxiety disorder, unspecified: Secondary | ICD-10-CM | POA: Insufficient documentation

## 2023-11-18 DIAGNOSIS — I10 Essential (primary) hypertension: Secondary | ICD-10-CM | POA: Insufficient documentation

## 2023-11-18 DIAGNOSIS — D329 Benign neoplasm of meninges, unspecified: Secondary | ICD-10-CM | POA: Diagnosis not present

## 2023-11-18 DIAGNOSIS — Z6841 Body Mass Index (BMI) 40.0 and over, adult: Secondary | ICD-10-CM | POA: Insufficient documentation

## 2023-11-18 DIAGNOSIS — K2100 Gastro-esophageal reflux disease with esophagitis, without bleeding: Secondary | ICD-10-CM | POA: Diagnosis not present

## 2023-11-18 DIAGNOSIS — N920 Excessive and frequent menstruation with regular cycle: Secondary | ICD-10-CM | POA: Diagnosis not present

## 2023-11-18 DIAGNOSIS — N946 Dysmenorrhea, unspecified: Secondary | ICD-10-CM | POA: Diagnosis not present

## 2023-11-18 DIAGNOSIS — N838 Other noninflammatory disorders of ovary, fallopian tube and broad ligament: Secondary | ICD-10-CM | POA: Insufficient documentation

## 2023-11-18 DIAGNOSIS — E669 Obesity, unspecified: Secondary | ICD-10-CM | POA: Diagnosis not present

## 2023-11-18 DIAGNOSIS — F32A Depression, unspecified: Secondary | ICD-10-CM | POA: Diagnosis not present

## 2023-11-18 DIAGNOSIS — F418 Other specified anxiety disorders: Secondary | ICD-10-CM | POA: Diagnosis not present

## 2023-11-18 DIAGNOSIS — J45909 Unspecified asthma, uncomplicated: Secondary | ICD-10-CM | POA: Insufficient documentation

## 2023-11-18 HISTORY — PX: ROBOTIC ASSISTED TOTAL HYSTERECTOMY WITH BILATERAL SALPINGO OOPHERECTOMY: SHX6086

## 2023-11-18 SURGERY — HYSTERECTOMY, TOTAL, ROBOT-ASSISTED, LAPAROSCOPIC, WITH BILATERAL SALPINGO-OOPHORECTOMY
Anesthesia: General | Site: Abdomen | Laterality: Bilateral

## 2023-11-18 MED ORDER — SUGAMMADEX SODIUM 200 MG/2ML IV SOLN
INTRAVENOUS | Status: DC | PRN
Start: 2023-11-18 — End: 2023-11-18
  Administered 2023-11-18: 200 mg via INTRAVENOUS

## 2023-11-18 MED ORDER — MIDAZOLAM HCL 5 MG/5ML IJ SOLN
INTRAMUSCULAR | Status: DC | PRN
  Administered 2023-11-18 (×2): 2 mg via INTRAVENOUS

## 2023-11-18 MED ORDER — DEXAMETHASONE SOD PHOSPHATE PF 10 MG/ML IJ SOLN
INTRAMUSCULAR | Status: DC | PRN
  Administered 2023-11-18: 8 mg via INTRAVENOUS

## 2023-11-18 MED ORDER — CEFAZOLIN SODIUM-DEXTROSE 2-4 GM/100ML-% IV SOLN
INTRAVENOUS | Status: AC
  Filled 2023-11-18: qty 100

## 2023-11-18 MED ORDER — POVIDONE-IODINE 10 % EX SWAB
2.0000 | Freq: Once | CUTANEOUS | Status: AC
  Administered 2023-11-18: 2 via TOPICAL

## 2023-11-18 MED ORDER — KETOROLAC TROMETHAMINE 10 MG PO TABS: 10.0000 mg | ORAL_TABLET | Freq: Three times a day (TID) | ORAL | 0 refills | Status: DC | PRN

## 2023-11-18 MED ORDER — LIDOCAINE 2% (20 MG/ML) 5 ML SYRINGE
INTRAMUSCULAR | Status: DC | PRN
  Administered 2023-11-18: 80 mg via INTRAVENOUS

## 2023-11-18 MED ORDER — LACTATED RINGERS IV SOLN: INTRAVENOUS | Status: DC | PRN

## 2023-11-18 MED ORDER — LIDOCAINE 2% (20 MG/ML) 5 ML SYRINGE
INTRAMUSCULAR | Status: AC
  Filled 2023-11-18: qty 5

## 2023-11-18 MED ORDER — ONDANSETRON HCL 4 MG/2ML IJ SOLN
INTRAMUSCULAR | Status: AC
  Filled 2023-11-18: qty 2

## 2023-11-18 MED ORDER — ONDANSETRON HCL 4 MG/2ML IJ SOLN: 4.0000 mg | Freq: Once | INTRAMUSCULAR | Status: DC | PRN

## 2023-11-18 MED ORDER — FENTANYL CITRATE (PF) 100 MCG/2ML IJ SOLN
INTRAMUSCULAR | Status: AC
  Filled 2023-11-18: qty 2

## 2023-11-18 MED ORDER — OXYCODONE HCL 5 MG/5ML PO SOLN: 5.0000 mg | Freq: Once | ORAL | Status: DC | PRN

## 2023-11-18 MED ORDER — FENTANYL CITRATE (PF) 50 MCG/ML IJ SOSY
25.0000 ug | PREFILLED_SYRINGE | INTRAMUSCULAR | Status: DC | PRN
  Administered 2023-11-18: 50 ug via INTRAVENOUS
  Filled 2023-11-18: qty 1

## 2023-11-18 MED ORDER — OXYCODONE-ACETAMINOPHEN 7.5-325 MG PO TABS: 1.0000 | ORAL_TABLET | Freq: Four times a day (QID) | ORAL | 0 refills | Status: DC | PRN

## 2023-11-18 MED ORDER — PROPOFOL 10 MG/ML IV BOLUS
INTRAVENOUS | Status: DC | PRN
Start: 2023-11-18 — End: 2023-11-18
  Administered 2023-11-18: 150 mg via INTRAVENOUS
  Administered 2023-11-18: 30 mg via INTRAVENOUS

## 2023-11-18 MED ORDER — ROCURONIUM BROMIDE 10 MG/ML (PF) SYRINGE
PREFILLED_SYRINGE | INTRAVENOUS | Status: AC
  Filled 2023-11-18: qty 10

## 2023-11-18 MED ORDER — DEXMEDETOMIDINE HCL IN NACL 80 MCG/20ML IV SOLN
INTRAVENOUS | Status: DC | PRN
  Administered 2023-11-18 (×2): 4 ug via INTRAVENOUS

## 2023-11-18 MED ORDER — SODIUM CHLORIDE 0.9 % IR SOLN
Status: DC | PRN
  Administered 2023-11-18: 3000 mL
  Administered 2023-11-18: 500 mL

## 2023-11-18 MED ORDER — STERILE WATER FOR IRRIGATION IR SOLN
Status: DC | PRN
  Administered 2023-11-18: 1000 mL

## 2023-11-18 MED ORDER — BUPIVACAINE HCL 0.25 % IJ SOLN
INTRAMUSCULAR | Status: DC | PRN
  Administered 2023-11-18: 40 mL

## 2023-11-18 MED ORDER — ONDANSETRON 8 MG PO TBDP: 8.0000 mg | ORAL_TABLET | Freq: Three times a day (TID) | ORAL | 0 refills | Status: DC | PRN

## 2023-11-18 MED ORDER — MIDAZOLAM HCL 2 MG/2ML IJ SOLN
INTRAMUSCULAR | Status: AC
  Filled 2023-11-18: qty 2

## 2023-11-18 MED ORDER — FENTANYL CITRATE (PF) 100 MCG/2ML IJ SOLN
INTRAMUSCULAR | Status: DC | PRN
  Administered 2023-11-18 (×4): 50 ug via INTRAVENOUS

## 2023-11-18 MED ORDER — CIPROFLOXACIN HCL 500 MG PO TABS: 500.0000 mg | ORAL_TABLET | Freq: Two times a day (BID) | ORAL | 0 refills | Status: DC

## 2023-11-18 MED ORDER — BUPIVACAINE HCL (PF) 0.25 % IJ SOLN
INTRAMUSCULAR | Status: AC
  Filled 2023-11-18: qty 60

## 2023-11-18 MED ORDER — ONDANSETRON HCL 4 MG/2ML IJ SOLN
INTRAMUSCULAR | Status: DC | PRN
Start: 2023-11-18 — End: 2023-11-18
  Administered 2023-11-18: 4 mg via INTRAVENOUS

## 2023-11-18 MED ORDER — CEFAZOLIN SODIUM-DEXTROSE 2-4 GM/100ML-% IV SOLN
2.0000 g | INTRAVENOUS | Status: AC
  Administered 2023-11-18: 2 g via INTRAVENOUS

## 2023-11-18 MED ORDER — KETOROLAC TROMETHAMINE 30 MG/ML IJ SOLN
30.0000 mg | INTRAMUSCULAR | Status: AC
  Administered 2023-11-18: 30 mg via INTRAVENOUS
  Filled 2023-11-18: qty 1

## 2023-11-18 MED ORDER — OXYCODONE HCL 5 MG PO TABS: 5.0000 mg | ORAL_TABLET | Freq: Once | ORAL | Status: DC | PRN

## 2023-11-18 MED ORDER — HEMOSTATIC AGENTS (NO CHARGE) OPTIME
TOPICAL | Status: DC | PRN
  Administered 2023-11-18 (×2): 1 via TOPICAL

## 2023-11-18 MED ORDER — ROCURONIUM BROMIDE 10 MG/ML (PF) SYRINGE
PREFILLED_SYRINGE | INTRAVENOUS | Status: DC | PRN
Start: 2023-11-18 — End: 2023-11-18
  Administered 2023-11-18: 60 mg via INTRAVENOUS
  Administered 2023-11-18 (×2): 20 mg via INTRAVENOUS

## 2023-11-18 MED ORDER — PROPOFOL 10 MG/ML IV BOLUS
INTRAVENOUS | Status: AC
  Filled 2023-11-18: qty 20

## 2023-11-18 SURGICAL SUPPLY — 50 items
APPLICATOR COTTON TIP 6 STRL (MISCELLANEOUS) ×2 IMPLANT
BLADE SURG SZ11 CARB STEEL (BLADE) ×2 IMPLANT
CAUTERY HOOK MNPLR 1.6 DVNC XI (INSTRUMENTS) ×1 IMPLANT
COVER LIGHT HANDLE STERIS (MISCELLANEOUS) ×2 IMPLANT
COVER MAYO STAND XLG (MISCELLANEOUS) ×2 IMPLANT
DERMABOND ADVANCED .7 DNX12 (GAUZE/BANDAGES/DRESSINGS) ×2 IMPLANT
DRAPE ARM DVNC X/XI (DISPOSABLE) ×8 IMPLANT
DRAPE COLUMN DVNC XI (DISPOSABLE) ×1 IMPLANT
DRIVER NDL MEGA SUTCUT DVNCXI (INSTRUMENTS) ×1 IMPLANT
DRIVER NDLE MEGA SUTCUT DVNCXI (INSTRUMENTS) ×1 IMPLANT
ELECTRODE REM PT RTRN 9FT ADLT (ELECTROSURGICAL) ×2 IMPLANT
FORCEPS PROGRASP DVNC XI (FORCEP) ×1 IMPLANT
GAUZE 4X4 16PLY ~~LOC~~+RFID DBL (SPONGE) ×4 IMPLANT
GLOVE BIOGEL PI IND STRL 7.0 (GLOVE) ×4 IMPLANT
GLOVE BIOGEL PI IND STRL 8 (GLOVE) ×4 IMPLANT
GLOVE ECLIPSE 8.0 STRL XLNG CF (GLOVE) ×6 IMPLANT
GOWN STRL REUS W/TWL LRG LVL3 (GOWN DISPOSABLE) ×2 IMPLANT
GOWN STRL REUS W/TWL XL LVL3 (GOWN DISPOSABLE) ×2 IMPLANT
KIT PINK PAD W/HEAD ARM REST (MISCELLANEOUS) ×1 IMPLANT
KIT TURNOVER CYSTO (KITS) ×1 IMPLANT
MANIFOLD NEPTUNE II (INSTRUMENTS) ×1 IMPLANT
NDL HYPO 21X1.5 SAFETY (NEEDLE) ×2 IMPLANT
NDL INSUFFLATION 14GA 120MM (NEEDLE) ×1 IMPLANT
NEEDLE HYPO 21X1.5 SAFETY (NEEDLE) ×1 IMPLANT
NEEDLE INSUFFLATION 14GA 120MM (NEEDLE) ×1 IMPLANT
NS IRRIG 500ML POUR BTL (IV SOLUTION) ×1 IMPLANT
OBTURATOR OPTICALSTD 8 DVNC (TROCAR) ×1 IMPLANT
PACK PERI GYN (CUSTOM PROCEDURE TRAY) ×1 IMPLANT
POWDER SURGICEL 3.0 GRAM (HEMOSTASIS) IMPLANT
RUMI II 3.0CM BLUE KOH-EFFICIE (DISPOSABLE) IMPLANT
SEAL UNIV 5-12 XI (MISCELLANEOUS) ×6 IMPLANT
SEALER VESSEL EXT DVNC XI (MISCELLANEOUS) ×1 IMPLANT
SET BASIN LINEN APH (SET/KITS/TRAYS/PACK) ×2 IMPLANT
SET IRRIG TUBING LAPAROSCOPIC (IRRIGATION / IRRIGATOR) ×2 IMPLANT
SET TUBE IRRIG SUCTION NO TIP (IRRIGATION / IRRIGATOR) ×2 IMPLANT
SOLUTION ANTFG W/FOAM PAD STRL (MISCELLANEOUS) ×2 IMPLANT
SPONGE T-LAP 18X18 ~~LOC~~+RFID (SPONGE) ×1 IMPLANT
SUT VICRYL 0 UR6 27IN ABS (SUTURE) ×1 IMPLANT
SUT VICRYL AB 3-0 FS1 BRD 27IN (SUTURE) ×2 IMPLANT
SUTURE STRATFX 0 PDS+ CT-2 23 (SUTURE) ×1 IMPLANT
SYR 10ML LL (SYRINGE) ×2 IMPLANT
SYR 20ML LL LF (SYRINGE) IMPLANT
SYR 50ML LL SCALE MARK (SYRINGE) ×4 IMPLANT
SYR CONTROL 10ML LL (SYRINGE) ×4 IMPLANT
TIP ENDOSCOPIC SURGICEL (TIP) IMPLANT
TIP RUMI ORANGE 6.7MMX12CM (TIP) ×1 IMPLANT
TIP UTERINE 5.1X6CM LAV DISP (MISCELLANEOUS) IMPLANT
TRAY FOLEY SLVR 16FR LF STAT (SET/KITS/TRAYS/PACK) ×2 IMPLANT
TROCAR KII 8X100ML NONTHREADED (TROCAR) ×2 IMPLANT
WATER STERILE IRR 500ML POUR (IV SOLUTION) ×1 IMPLANT

## 2023-11-18 NOTE — Anesthesia Preprocedure Evaluation (Signed)
 Anesthesia Evaluation  Patient identified by MRN, date of birth, ID band Patient awake    Reviewed: Allergy & Precautions, H&P , NPO status , Patient's Chart, lab work & pertinent test results, reviewed documented beta blocker date and time   History of Anesthesia Complications (+) PONV and history of anesthetic complications  Airway Mallampati: II  TM Distance: >3 FB Neck ROM: full    Dental no notable dental hx.    Pulmonary asthma    Pulmonary exam normal breath sounds clear to auscultation       Cardiovascular Exercise Tolerance: Good hypertension, negative cardio ROS  Rhythm:regular Rate:Normal     Neuro/Psych  Headaches PSYCHIATRIC DISORDERS Anxiety Depression       GI/Hepatic Neg liver ROS,GERD  ,,  Endo/Other    Class 4 obesity  Renal/GU negative Renal ROS  negative genitourinary   Musculoskeletal   Abdominal   Peds  Hematology negative hematology ROS (+)   Anesthesia Other Findings   Reproductive/Obstetrics negative OB ROS                              Anesthesia Physical Anesthesia Plan  ASA: 3  Anesthesia Plan: General and General ETT   Post-op Pain Management:    Induction:   PONV Risk Score and Plan: Ondansetron   Airway Management Planned:   Additional Equipment:   Intra-op Plan:   Post-operative Plan:   Informed Consent: I have reviewed the patients History and Physical, chart, labs and discussed the procedure including the risks, benefits and alternatives for the proposed anesthesia with the patient or authorized representative who has indicated his/her understanding and acceptance.     Dental Advisory Given  Plan Discussed with: CRNA  Anesthesia Plan Comments:         Anesthesia Quick Evaluation

## 2023-11-18 NOTE — Transfer of Care (Signed)
 Immediate Anesthesia Transfer of Care Note  Patient: Kendra Schneider  Procedure(s) Performed: HYSTERECTOMY, TOTAL, ROBOT-ASSISTED, LAPAROSCOPIC, WITH BILATERAL SALPINGECTOMY. (Bilateral: Abdomen)  Patient Location: PACU  Anesthesia Type:General  Level of Consciousness: awake  Airway & Oxygen Therapy: Patient Spontanous Breathing  Post-op Assessment: Report given to RN  Post vital signs: Reviewed and stable  Last Vitals:  Vitals Value Taken Time  BP    Temp    Pulse 107 11/18/23 13:41  Resp 21 11/18/23 13:40  SpO2 91 % 11/18/23 13:41  Vitals shown include unfiled device data.  Last Pain:  Vitals:   11/18/23 0810  TempSrc:   PainSc: 4       Patients Stated Pain Goal: 4 (11/18/23 0755)  Complications: No notable events documented.
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
## 2023-11-18 NOTE — Anesthesia Procedure Notes (Signed)
 Procedure Name: Intubation Date/Time: 11/18/2023 10:22 AM  Performed by: Toribio Darice BRAVO, CRNAPre-anesthesia Checklist: Patient identified, Patient being monitored, Timeout performed, Emergency Drugs available and Suction available Patient Re-evaluated:Patient Re-evaluated prior to induction Oxygen Delivery Method: Circle system utilized Preoxygenation: Pre-oxygenation with 100% oxygen Induction Type: IV induction Ventilation: Mask ventilation without difficulty Laryngoscope Size: Mac and 3 Grade View: Grade I Tube type: Oral Tube size: 7.0 mm Number of attempts: 1 Airway Equipment and Method: Stylet Placement Confirmation: ETT inserted through vocal cords under direct vision, positive ETCO2 and breath sounds checked- equal and bilateral Secured at: 21 cm Tube secured with: Tape Dental Injury: Teeth and Oropharynx as per pre-operative assessment

## 2023-11-18 NOTE — Op Note (Signed)
 Preoperative diagnosis: Menorrhagia Dysmenorrhea meningioma   Postoperative diagnosis: SAA   Procedure: dV5 Robotic hysterectomy with bilateral salpingectomy  Laparoscopic guided transversus abdominus plane block for post op pain management  Surgeon: Vonn VEAR Inch, MD   Anesthesia: General endotracheal   Findings: Very small uterus, large lipoma   Description of operation: Patient was taken to the operating room and placed in the low lithotomy position She was prepped and draped in the usual sterile fashion robotic assisted laparoscopic procedure A Foley catheter was placed The vagina was once again prepped additionally   A RUMI II 6 cm with 2 cm cervical cup was placed for uterine manipulation and colpotomy delineation   An incision was made above the umbilicus The umbilical fascia was grasped A varies needle was used and placed into the peritoneum with 1 pass A pneumoperitoneum was created to a pressure of 15   An 8 mm port was placed into the peritoneal cavity using a nonbladed trocar easily with 1 pass using the video laparoscope The peritoneal cavity was confirmed   There were no unusual findings 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   3 additional 8 mm ports were placed at approximately the same level as the supraumbilical port 2 of the ports were robotic and 1 is an assist port   One was left lateral and 1 was placed right lateral, both lateral to the rectus anterior muscle These were placed under direct visualization without difficulty into the peritoneal cavity Nonbladed trocars were used in all instances    The patient was placed in 24 degrees of Trendelenburg   The [employer] robot was then docked to the 3 robotic ports   Instruments used during the robotic hysterectomy: Vessel sealer extender using bipolar energy ProGrasp forceps with no energy Monopolar hook Megacut needle driver 2-0 PDS symmetrical  STRATAFIX on a CT 2 needle   I then left the patient and went to the surgical console   The RUMI II  was used throughout the case to apply traction and anterior/posterior displacement of the uterus to facilitate the robotic procedure The ProGrasp was used to put traction on the left adnexa The left ureter was identified and found to be well away from the adnexal vessels The vessel sealer extender using bipolar energy was used and the utero-ovarian ligament was coagulated and then transected I used traction medially and anteriorly and used the vessel sealer to take the broad ligament and round ligament down to the level of the cervical isthmus just lateral to the left uterine vessels   I then turned my attention to the right adnexa The pro grasp was used and medial and upward traction was placed The vessel sealer extender using bipolar energy was used to coagulate and ligate the right infundibulopelvic ligament vessels Again the right ureter was well inferior to the vessels I continued use medial and anterior retraction using the ProGrasp and the vessel sealer with the bipolar energy was used to take the broad ligament and round ligament down to the level of the cervical isthmus and lateral to the uterine vessels   I then placed the monopolar hook in the place of the ProGrasp The vessel sealer extender was used to grasp the peritoneum of the bladder provide traction, of course no energy was used for this I used the monopolar hook with energy to open of the peritoneal leaf anteriorly and dissect the bladder peritoneum off the lower uterine segment and cervix  beyond the level of the vaginal colpotomy cup I then used the monopolar hook to take the peritoneum down where I stopped using the vessel sealer and met in the bladder dissection bilaterally   The vessel sealer extender with monopolar energy was used to coagulate the uterine vessels at the level of the cervical isthmus bilaterally and then  just above and just below to manage backbleeding The uterine vessels were transected There was good hemostasis I then stayed medial to this uterine vessel pedicle with the vessel sealer extender and took down the paracervical tissue to allow colpotomy incision using the monopolar hook, preserving the cardinal ligament Again there was good hemostasis bilaterally   I then used the monopolar hook and made a posterior colpotomy incision above the level of insertion of the uterosacral ligaments I used a frowny face then smiley face technique and opened the vagina to approximately 8:00 and 4:00 posteriorly I then used the vessel sealer extender with bipolar energy, coagulating and then transecting the vagina   The monopolar hook was used to make the anterior colpotomy incision as the bladder had been dissected well past the colpotomy ring Cephalad tension was placed on the Vcare handle throughout this portion of the procedure to prevent thermal injury to the bladder and safe distance from the ureters bilaterally Colpotomy incision was made from 10:00 to 2:00 The vessel sealer with bipolar energy was then used to coagulate and transect the vagina, again inside of the cardinal ligament   The uterus was removed through the uterus   The tubes were also removed through the vagina A wet lap tape was placed in the vagina to maintain pneumoperitoneum   All pedicles were found to be hemostatic there was very little blood loss I then removed the vessel sealer and monopolar hook The pro grasp was replaced and the needle driver was also placed along with the suture   The vagina was closed using the 2-0 PDS STRATAFIX symmetrical suture on the CT 2 needle I pay close attention to the vaginal corners bilaterally and incorporated those in the closure 1.5 cm of vagina was operating to the vaginal closure I also fixed the uterosacral ligaments to the vaginal cuff repair shortening the uterosacral ligaments thus  providing improved vaginal vault suspension The cardinal ligaments were intact again improving postoperative vaginal support Pubocervical rectovaginal and posterior peritoneum were all included in the vaginal closure There was good result hemostasis   At the end of the procedure all the pedicles were identified and found to be hemostatic Both ureters were identified and undergoing normal peristalsis, they were well out of the way of surgical field throughout   I then got up from the console and went back to the side of the patient after gowning and gloving sterilely  I placed a  transversus abdominus plane block was placed using laparoscopic guidance at T10 and T7 bilaterally, 10 cc of 0.25% bupivacaine at each site, 40 cc total of 100 mg of bupivacaine.  Doyle's bubble technique was used. This was placed for post operative pain management.       The 4 robotic ports were removed The insufflation ports were used to actively desufflate the peritoneal cavity to a pressure of 0 Ports were then removed   The subcutaneous tissue of all 4 incisions was closed using 0 Vicryl All 4 skin incisions were closed using 3-0 vicryl in a subcuticular manner Dermabond was used all 4 incision sites  The patient tolerated the procedure well She  received 2 g of Ancef and 30 mg of Toradol preoperatively   EBL: 50 cc   Vonn VEAR Inch, MD 11/18/2023 1:25 PM

## 2023-11-19 ENCOUNTER — Encounter (HOSPITAL_COMMUNITY): Payer: Self-pay | Admitting: Obstetrics & Gynecology

## 2023-11-20 LAB — SURGICAL PATHOLOGY

## 2023-11-20 NOTE — Anesthesia Postprocedure Evaluation (Signed)
 Anesthesia Post Note  Patient: Kendra Schneider  Procedure(s) Performed: HYSTERECTOMY, TOTAL, ROBOT-ASSISTED, LAPAROSCOPIC, WITH BILATERAL SALPINGECTOMY. (Bilateral: Abdomen)  Patient location during evaluation: Phase II Anesthesia Type: General Level of consciousness: awake Pain management: pain level controlled Vital Signs Assessment: post-procedure vital signs reviewed and stable Respiratory status: spontaneous breathing and respiratory function stable Cardiovascular status: blood pressure returned to baseline and stable Postop Assessment: no headache and no apparent nausea or vomiting Anesthetic complications: no Comments: Late entry   No notable events documented.   Last Vitals:  Vitals:   11/18/23 1415 11/18/23 1453  BP: 111/65 (!) 107/57  Pulse: (!) 101 99  Resp: (!) 31 18  Temp:  36.6 C  SpO2: 96% 93%    Last Pain:  Vitals:   11/18/23 1453  TempSrc: Oral  PainSc: 5                  Yvonna JINNY Bosworth

## 2023-11-30 ENCOUNTER — Encounter: Payer: Self-pay | Admitting: Obstetrics & Gynecology

## 2023-11-30 ENCOUNTER — Telehealth: Payer: Self-pay

## 2023-11-30 ENCOUNTER — Ambulatory Visit (INDEPENDENT_AMBULATORY_CARE_PROVIDER_SITE_OTHER): Admitting: Obstetrics & Gynecology

## 2023-11-30 ENCOUNTER — Telehealth: Payer: Self-pay | Admitting: Obstetrics & Gynecology

## 2023-11-30 VITALS — BP 113/82 | HR 106 | Ht 60.0 in | Wt 217.0 lb

## 2023-11-30 DIAGNOSIS — G8918 Other acute postprocedural pain: Secondary | ICD-10-CM

## 2023-11-30 DIAGNOSIS — K5901 Slow transit constipation: Secondary | ICD-10-CM

## 2023-11-30 MED ORDER — PROMETHAZINE HCL 25 MG PO TABS: 25.0000 mg | ORAL_TABLET | Freq: Four times a day (QID) | ORAL | 1 refills | Status: AC | PRN

## 2023-11-30 MED ORDER — GABAPENTIN 300 MG PO CAPS: 300.0000 mg | ORAL_CAPSULE | Freq: Three times a day (TID) | ORAL | 0 refills | Status: DC

## 2023-11-30 NOTE — Telephone Encounter (Signed)
 Attempted to call patient back after message left with front staff. Call not answered and unable to leave voicemail box not set up. Will call again later

## 2023-11-30 NOTE — Progress Notes (Signed)
    PostOp Visit Note  Kendra Schneider is a 38 y.o. G0P0 female who presents for a postoperative visit. She is 1 weeks postop following a RAH, BS completed on 11/18/23 with Dr. Jayne.  Patient today notes that she has completed her pain medicine and still notes abdominal discomfort.  She notes that she has also been struggling with nausea and vomiting.  She has been taking Phenergan , which does seem to help, but she is running low.  She notes that she has only had a very small BM since surgery.  She did try a suppository which only helped a little bit.  It is unclear whether or not she is passing gas.  She denies fevers or chills.    Review of Systems Pertinent items are noted in HPI.    Objective:  BP 113/82 (BP Location: Left Arm, Patient Position: Sitting, Cuff Size: Normal)   Pulse (!) 106   Ht 5' (1.524 m)   Wt 217 lb (98.4 kg)   LMP  (LMP Unknown)   BMI 42.38 kg/m    Physical Examination:  GENERAL ASSESSMENT: well developed and well nourished SKIN: normal color, no lesions CHEST: normal air exchange, respiratory effort normal with no retractions HEART: regular rate and rhythm ABDOMEN: left sided hernia/mass- unchanged from previously, soft, +BS, no rebound or guarding, appropriately tender INCISION: C/D/I healing appropriately EXTREMITY: no edema or calf tenderness PSYCH: mood appropriate, normal affect       Assessment:    Postop pain/constipation   Plan:   -no evidence of ileus on exam -reviewed conservative options regarding constipation -Rx gabapentin sent in -no further narcotics at this time.  Continue tylenol  prn -f/u next week - No worsening of symptoms persistent vomiting or other acute concerns, call office or head to hospital for further evaluation  Karn Derk, DO Attending Obstetrician & Gynecologist, [facility], [facility]

## 2023-11-30 NOTE — Telephone Encounter (Signed)
 Pt states she has nausea and is in pain and little to no bowel movement. Please advise

## 2023-11-30 NOTE — Telephone Encounter (Signed)
 Patient to have appointment with provider regarding message left with front staff. No further follow up needed by RN

## 2023-12-07 ENCOUNTER — Encounter: Admitting: Obstetrics & Gynecology

## 2023-12-07 ENCOUNTER — Telehealth: Payer: Self-pay | Admitting: Obstetrics & Gynecology

## 2023-12-07 NOTE — Telephone Encounter (Signed)
 Pt is having trouble with incisions. Pt would like to have a call from the office.

## 2023-12-07 NOTE — Telephone Encounter (Signed)
 Patient states incision has become red and painful with a small amount of pus draining.  Discussed with Dr Jayne and patient advised to apply neosporin to the area and will evaluate at appointment tomorrow. Patient verbalized understanding with no further questions.

## 2023-12-08 ENCOUNTER — Encounter: Payer: Self-pay | Admitting: Obstetrics & Gynecology

## 2023-12-08 ENCOUNTER — Ambulatory Visit (INDEPENDENT_AMBULATORY_CARE_PROVIDER_SITE_OTHER): Admitting: Obstetrics & Gynecology

## 2023-12-08 VITALS — BP 110/77 | HR 93 | Ht 60.0 in | Wt 217.0 lb

## 2023-12-08 DIAGNOSIS — K439 Ventral hernia without obstruction or gangrene: Secondary | ICD-10-CM

## 2023-12-08 DIAGNOSIS — Z9889 Other specified postprocedural states: Secondary | ICD-10-CM

## 2023-12-08 MED ORDER — CEPHALEXIN 500 MG PO CAPS: 500.0000 mg | ORAL_CAPSULE | Freq: Three times a day (TID) | ORAL | 0 refills | Status: DC

## 2023-12-08 NOTE — Progress Notes (Signed)
 HPI: Patient returns for routine postoperative follow-up having undergone   No diagnosis found.   The patient's immediate postoperative recovery has been unremarkable. Since hospital discharge the patient reports some irritation with 1 incision.   Current Outpatient Medications: acetaminophen  (TYLENOL ) 500 MG tablet, Take 1,000 mg by mouth every 6 (six) hours as needed for mild pain., Disp: , Rfl:  albuterol  (VENTOLIN  HFA) 108 (90 Base) MCG/ACT inhaler, Inhale 2 puffs into the lungs 4 (four) times daily as needed for wheezing or shortness of breath., Disp: , Rfl:  cephALEXin (KEFLEX) 500 MG capsule, Take 1 capsule (500 mg total) by mouth 3 (three) times daily., Disp: 21 capsule, Rfl: 0 cetirizine (ZYRTEC) 10 MG tablet, Take 10 mg by mouth daily., Disp: , Rfl:  diphenhydrAMINE  (BENADRYL ) 25 mg capsule, Take 25 mg by mouth every 6 (six) hours as needed for allergies., Disp: , Rfl:  EPINEPHrine  0.3 mg/0.3 mL IJ SOAJ injection, Inject 0.3 mg into the muscle as needed for anaphylaxis. , Disp: , Rfl: 12 gabapentin (NEURONTIN) 300 MG capsule, Take 1 capsule (300 mg total) by mouth 3 (three) times daily for 3 days., Disp: 9 capsule, Rfl: 0 glycopyrrolate  (ROBINUL ) 1 MG tablet, Take 1 mg by mouth 2 (two) times daily. , Disp: , Rfl:  hydrOXYzine  (ATARAX /VISTARIL ) 10 MG tablet, Take 10 mg by mouth at bedtime as needed for itching or anxiety. , Disp: , Rfl:  omeprazole (PRILOSEC) 10 MG capsule, Take 20 mg by mouth 2 (two) times daily. , Disp: , Rfl:  ondansetron  (ZOFRAN -ODT) 4 MG disintegrating tablet, Take 1 tablet (4 mg total) by mouth every 8 (eight) hours as needed for nausea., Disp: 30 tablet, Rfl: 3 ondansetron  (ZOFRAN -ODT) 8 MG disintegrating tablet, Take 1 tablet (8 mg total) by mouth every 8 (eight) hours as needed for nausea or vomiting., Disp: 8 tablet, Rfl: 0 promethazine  (PHENERGAN ) 25 MG tablet, Take 1 tablet (25 mg total) by mouth every 6 (six) hours as needed for nausea or vomiting.,  Disp: 30 tablet, Rfl: 1 rizatriptan  (MAXALT -MLT) 10 MG disintegrating tablet, Take 1 tablet (10 mg total) by mouth as needed for migraine. May repeat in 2 hours if needed. Max of 2 tablets in 24 hours., Disp: 9 tablet, Rfl: 11 rosuvastatin (CRESTOR) 10 MG tablet, Take 10 mg by mouth at bedtime., Disp: , Rfl:  sertraline  (ZOLOFT ) 50 MG tablet, Take 50 mg by mouth daily., Disp: , Rfl:  Tiotropium Bromide  Monohydrate (SPIRIVA  RESPIMAT) 1.25 MCG/ACT AERS, Inhale 1 puff into the lungs 2 (two) times daily., Disp: , Rfl:   No current facility-administered medications for this visit.    Blood pressure 110/77, pulse 93, height 5' (1.524 m), weight 217 lb (98.4 kg).  Physical Exam: Left lateral incision likely with superficial staph infection  Diagnostic Tests:   Pathology: benign  Impression + Management plan:   ICD-10-CM   1. Post-operative state: RA TLH + BS  Z98.890    superficial staph infection of 1 incision    2. Ventral hernia: use abdominal binder, refer to Dr Kallie after recovery from hysterectomy  K43.9           Medications Prescribed this encounter: Orders Placed This Encounter     cephALEXin (KEFLEX) 500 MG capsule         Sig: Take 1 capsule (500 mg total) by mouth 3 (three) times daily.         Dispense:  21 capsule         Refill:  0  Follow up: Return in about 1 month (around 01/07/2024) for Post Op, with Dr Schneider.    Kendra VEAR Jayne, MD Attending Physician for the Center for [facility] 12/08/2023 2:41 PM

## 2023-12-14 ENCOUNTER — Other Ambulatory Visit: Payer: Self-pay | Admitting: Obstetrics & Gynecology

## 2023-12-28 ENCOUNTER — Encounter: Payer: Self-pay | Admitting: Adult Health

## 2023-12-28 ENCOUNTER — Ambulatory Visit: Admitting: Adult Health

## 2023-12-28 VITALS — BP 107/78 | HR 92 | Ht 60.0 in | Wt 205.0 lb

## 2023-12-28 DIAGNOSIS — Z90710 Acquired absence of both cervix and uterus: Secondary | ICD-10-CM | POA: Insufficient documentation

## 2023-12-28 DIAGNOSIS — K439 Ventral hernia without obstruction or gangrene: Secondary | ICD-10-CM | POA: Insufficient documentation

## 2023-12-28 DIAGNOSIS — Z86011 Personal history of benign neoplasm of the brain: Secondary | ICD-10-CM

## 2023-12-28 NOTE — Progress Notes (Addendum)
  Subjective:     Patient ID: Kendra Schneider, female   DOB: Apr 22, 1985, 38 y.o.   MRN: [id number]  HPI Kendra Schneider is a 38 year old white female,single, sp RA TLH BS 11/18/23 doing well, no pain, she has follow up with Dr Jayne 01/08/24. This was a 3 month follow up appointment after stopping depo and trying Micronor  but had reaction so stopped that, and then decided to proceed with hysterectomy.      Component Value Date/Time   DIAGPAP  09/22/2023 1437    - Negative for intraepithelial lesion or malignancy (NILM)   DIAGPAP  06/26/2020 1547    - Negative for intraepithelial lesion or malignancy (NILM)   DIAGPAP  08/22/2016 0000    NEGATIVE FOR INTRAEPITHELIAL LESIONS OR MALIGNANCY. BENIGN REACTIVE/REPARATIVE CHANGES.   HPVHIGH Negative 09/22/2023 1437   HPVHIGH Negative 06/26/2020 1547   ADEQPAP  09/22/2023 1437    Satisfactory for evaluation. The presence or absence of an   ADEQPAP  09/22/2023 1437    endocervical/transformation zone component cannot be determined because   ADEQPAP of atrophy. 09/22/2023 1437   PCP is Dr Orpha   Review of Systems Denies any pain Has lost weight Reviewed past medical,surgical, social and family history. Reviewed medications and allergies.     Objective:   Physical Exam BP 107/78 (BP Location: Left Arm, Patient Position: Sitting, Cuff Size: Normal)   Pulse 92   Ht 5' (1.524 m)   Wt 205 lb (93 kg)   LMP  (LMP Unknown)   BMI 40.04 kg/m     Skin warm and dry.  Lungs: clear to ausculation bilaterally. Cardiovascular: regular rate and rhythm.  Has ventral hernia. Incisions healing.  Fall risk is low  Upstream - 12/28/23 1605       Pregnancy Intention Screening   Does the patient want to become pregnant in the next year? N/A    Does the patient's partner want to become pregnant in the next year? N/A    Would the patient like to discuss contraceptive options today? N/A      Contraception Wrap Up   Current Method Hysterectomy    End Method  Hysterectomy    Contraception Counseling Provided No          Assessment:     1. S/P hysterectomy RA TLH BS 11/18/23 (Primary) Denies any pain  No more paps needed  2. Ventral hernia: use abdominal binder, refer to Dr Kallie after recovery from hysterectomy  3. History of benign meningioma of brain    Plan:     Keep appt with Dr Jayne 01/08/24

## 2024-01-08 ENCOUNTER — Ambulatory Visit: Admitting: Obstetrics & Gynecology

## 2024-01-08 VITALS — BP 114/84 | HR 82

## 2024-01-08 DIAGNOSIS — Z90710 Acquired absence of both cervix and uterus: Secondary | ICD-10-CM

## 2024-01-08 DIAGNOSIS — K439 Ventral hernia without obstruction or gangrene: Secondary | ICD-10-CM

## 2024-01-08 NOTE — Progress Notes (Signed)
°  HPI: Patient returns for routine postoperative follow-up having undergone   (Z90.710) S/P hysterectomy RA TLH BS 11/18/23  (primary encounter diagnosis)   (K43.9) Ventral hernia: use abdominal binder, refer to Dr Kallie after recovery from hysterectomy    The patient's immediate postoperative recovery has been unremarkable. Since hospital discharge the patient reports occasional shooting pain thru last a coup-le seconds every few days, no bleeding.   Current Outpatient Medications: acetaminophen  (TYLENOL ) 500 MG tablet, Take 1,000 mg by mouth every 6 (six) hours as needed for mild pain., Disp: , Rfl:  albuterol  (VENTOLIN  HFA) 108 (90 Base) MCG/ACT inhaler, Inhale 2 puffs into the lungs 4 (four) times daily as needed for wheezing or shortness of breath., Disp: , Rfl:  diphenhydrAMINE  (BENADRYL ) 25 mg capsule, Take 25 mg by mouth every 6 (six) hours as needed for allergies., Disp: , Rfl:  EPINEPHrine  0.3 mg/0.3 mL IJ SOAJ injection, Inject 0.3 mg into the muscle as needed for anaphylaxis. , Disp: , Rfl: 12 glycopyrrolate  (ROBINUL ) 1 MG tablet, Take 1 mg by mouth 2 (two) times daily. , Disp: , Rfl:  hydrOXYzine  (ATARAX /VISTARIL ) 10 MG tablet, Take 10 mg by mouth at bedtime as needed for itching or anxiety. , Disp: , Rfl:  omeprazole (PRILOSEC) 10 MG capsule, Take 20 mg by mouth 2 (two) times daily. , Disp: , Rfl:  promethazine  (PHENERGAN ) 25 MG tablet, Take 1 tablet (25 mg total) by mouth every 6 (six) hours as needed for nausea or vomiting., Disp: 30 tablet, Rfl: 1 rizatriptan  (MAXALT -MLT) 10 MG disintegrating tablet, Take 1 tablet (10 mg total) by mouth as needed for migraine. May repeat in 2 hours if needed. Max of 2 tablets in 24 hours., Disp: 9 tablet, Rfl: 11 rosuvastatin (CRESTOR) 10 MG tablet, Take 10 mg by mouth at bedtime., Disp: , Rfl:  sertraline  (ZOLOFT ) 50 MG tablet, Take 50 mg by mouth daily., Disp: , Rfl:  Tiotropium Bromide  Monohydrate (SPIRIVA  RESPIMAT) 1.25 MCG/ACT AERS,  Inhale 1 puff into the lungs 2 (two) times daily., Disp: , Rfl:   No current facility-administered medications for this visit.    Blood pressure 114/84, pulse 82.  Physical Exam: All incisions well healed Ventral hernia present Cuff intact manually  Diagnostic Tests:   Pathology: benign  Impression + Management plan: (Z90.710) S/P hysterectomy RA TLH BS 11/18/23  (primary encounter diagnosis)   (K43.9) Ventral hernia: use abdominal binder, refer to Dr Kallie after recovery from hysterectomy      Medications Prescribed this encounter: No orders of the defined types were placed in this encounter.     Follow up: No follow-ups on file.    Vonn VEAR Inch, MD Attending Physician for the Center for [facility] 01/08/2024 10:41 AM
